# Patient Record
Sex: Female | Born: 1985
Health system: Southern US, Community
[De-identification: ages and names within clinical notes are randomized; demographics above are authoritative.]

## PROBLEM LIST (undated history)

## (undated) DIAGNOSIS — E079 Disorder of thyroid, unspecified: Secondary | ICD-10-CM

## (undated) DIAGNOSIS — F32A Depression, unspecified: Secondary | ICD-10-CM

## (undated) DIAGNOSIS — F329 Major depressive disorder, single episode, unspecified: Secondary | ICD-10-CM

## (undated) DIAGNOSIS — U071 COVID-19: Secondary | ICD-10-CM

## (undated) DIAGNOSIS — E039 Hypothyroidism, unspecified: Secondary | ICD-10-CM

## (undated) DIAGNOSIS — E611 Iron deficiency: Secondary | ICD-10-CM

## (undated) DIAGNOSIS — E669 Obesity, unspecified: Secondary | ICD-10-CM

## (undated) DIAGNOSIS — F419 Anxiety disorder, unspecified: Secondary | ICD-10-CM

## (undated) HISTORY — DX: Disorder of thyroid, unspecified: E07.9

## (undated) HISTORY — DX: Anxiety disorder, unspecified: F41.9

## (undated) HISTORY — DX: Major depressive disorder, single episode, unspecified: F32.9

## (undated) HISTORY — DX: Iron deficiency: E61.1

## (undated) HISTORY — DX: COVID-19: U07.1

## (undated) HISTORY — DX: Obesity, unspecified: E66.9

## (undated) HISTORY — DX: Depression, unspecified: F32.A

---

## 2007-05-14 ENCOUNTER — Ambulatory Visit (HOSPITAL_COMMUNITY): Admission: RE | Admit: 2007-05-14 | Discharge: 2007-05-14 | Payer: Self-pay | Admitting: Emergency Medicine

## 2007-05-14 ENCOUNTER — Emergency Department (HOSPITAL_COMMUNITY): Admission: EM | Admit: 2007-05-14 | Discharge: 2007-05-14 | Payer: Self-pay | Admitting: Emergency Medicine

## 2008-04-11 ENCOUNTER — Inpatient Hospital Stay (HOSPITAL_COMMUNITY): Admission: AD | Admit: 2008-04-11 | Discharge: 2008-04-12 | Payer: Self-pay | Admitting: Obstetrics and Gynecology

## 2008-06-13 IMAGING — US US ABDOMEN COMPLETE
1 series · 14 of 25 positions shown · non-contrast
Comparison: None.

CLINICAL DATA: Abdominal pain. Nausea. 
 ABDOMEN ULTRASOUND:
TECHNIQUE: Complete abdominal ultrasound examination was performed including evaluation of the liver, gallbladder, bile ducts, pancreas, kidneys, spleen, IVC, and abdominal aorta.

[Series 1: unknown · 0.28mm/px · 14 of 82 slices shown]
[im 1/82]
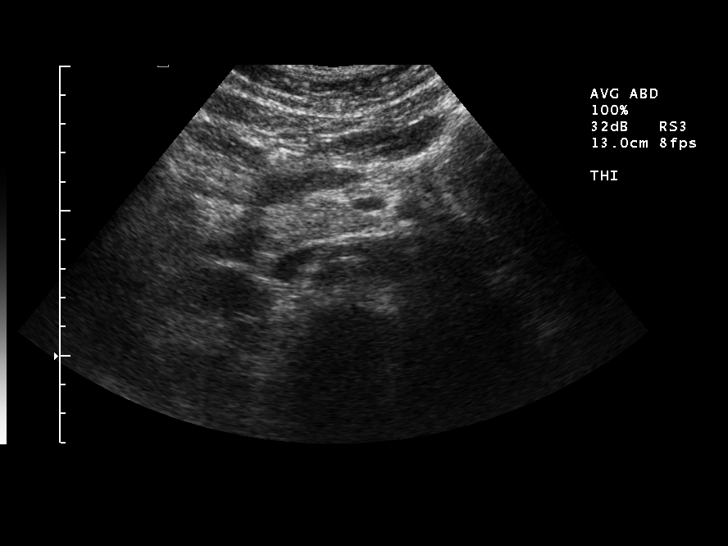
[im 7/82]
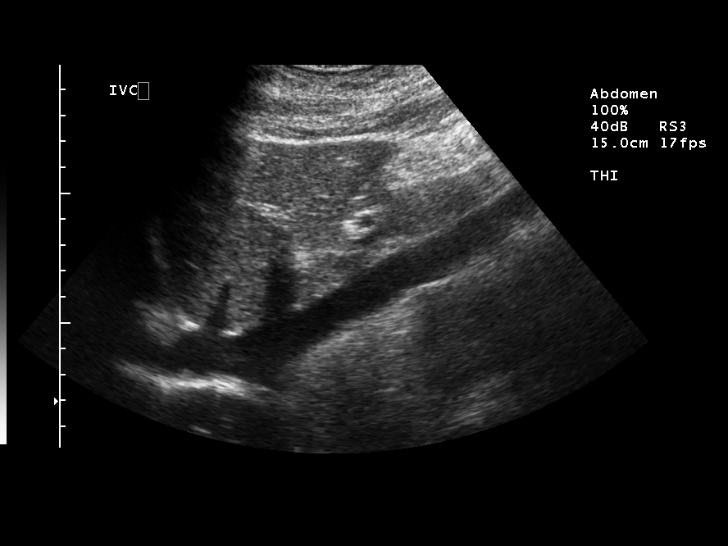
[im 14/82]
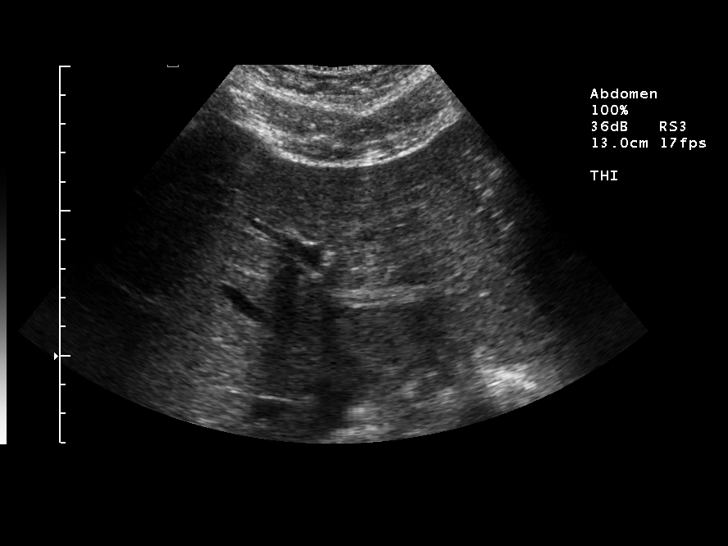
[im 21/82]
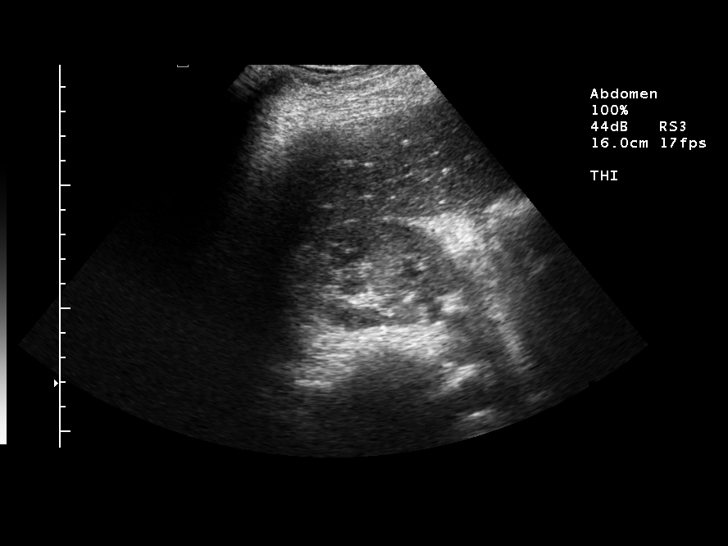
[im 28/82]
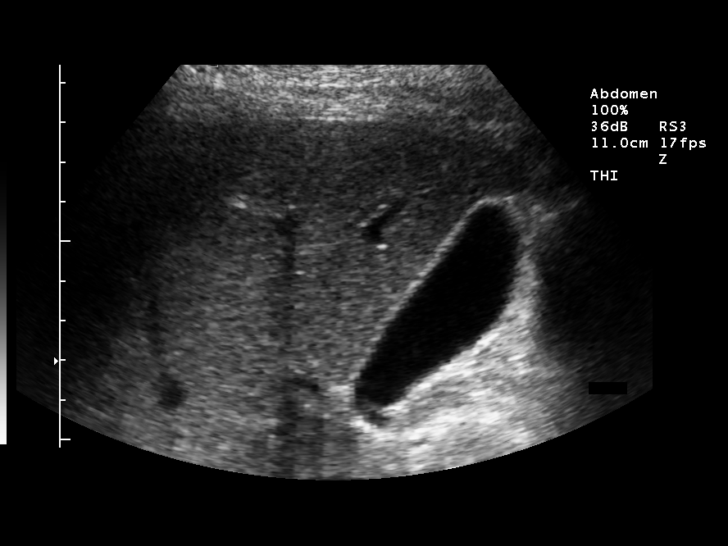
[im 31/82]
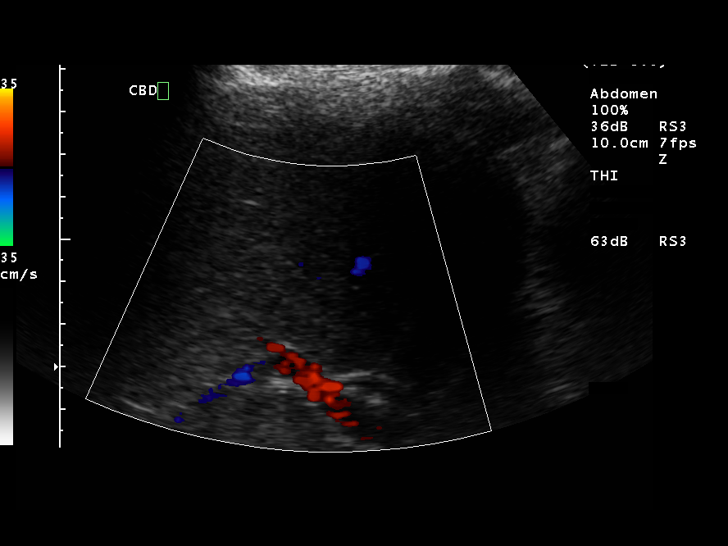
[im 38/82]
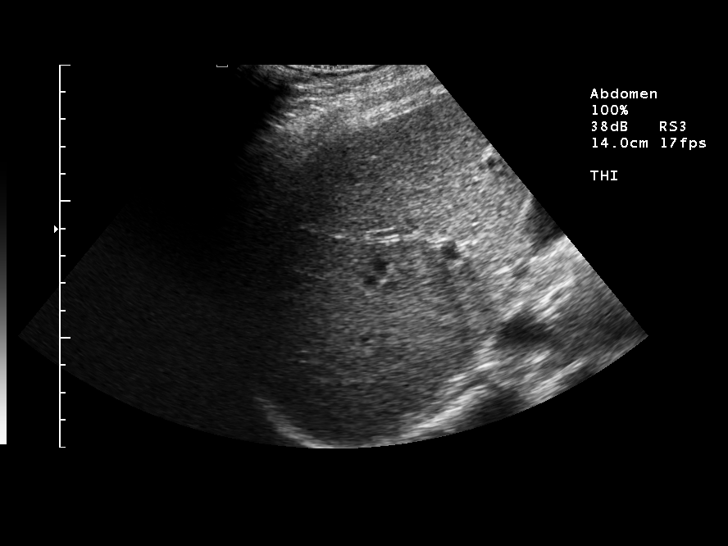
[im 44/82]
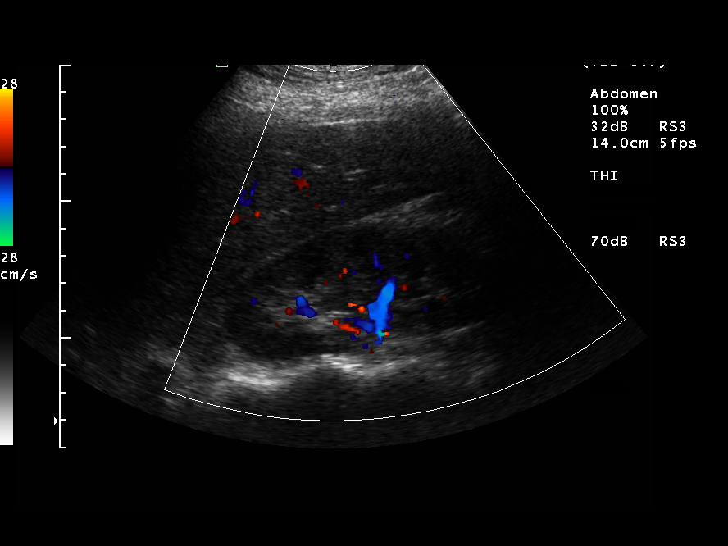
[im 51/82]
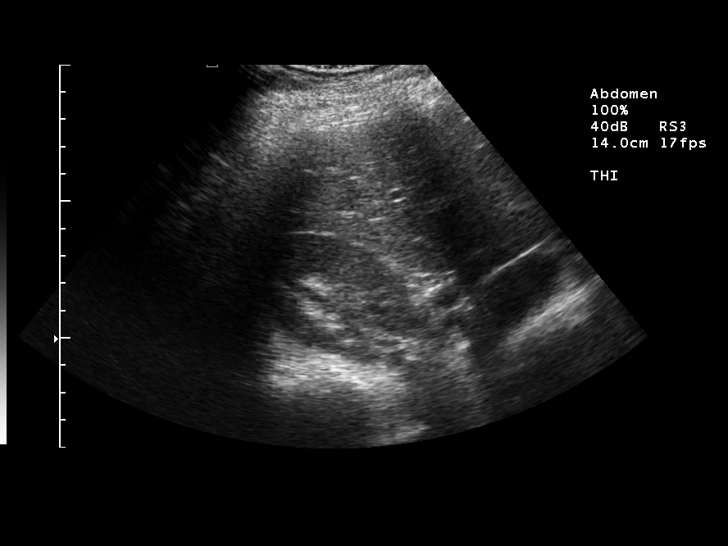
[im 55/82]
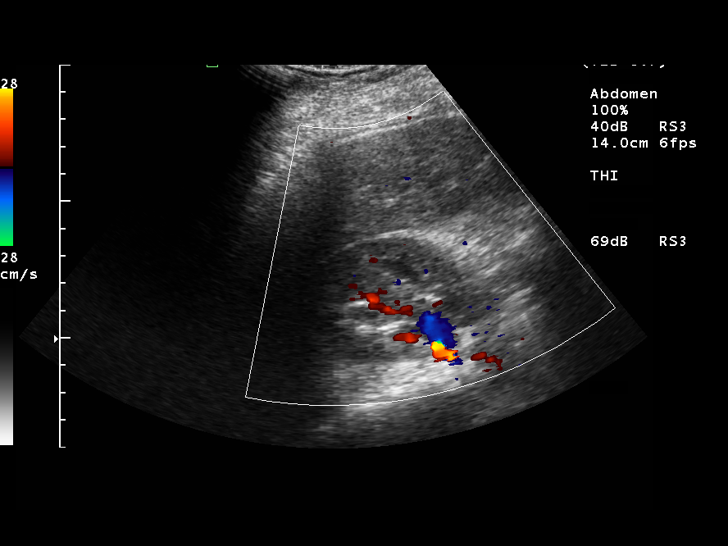
[im 61/82]
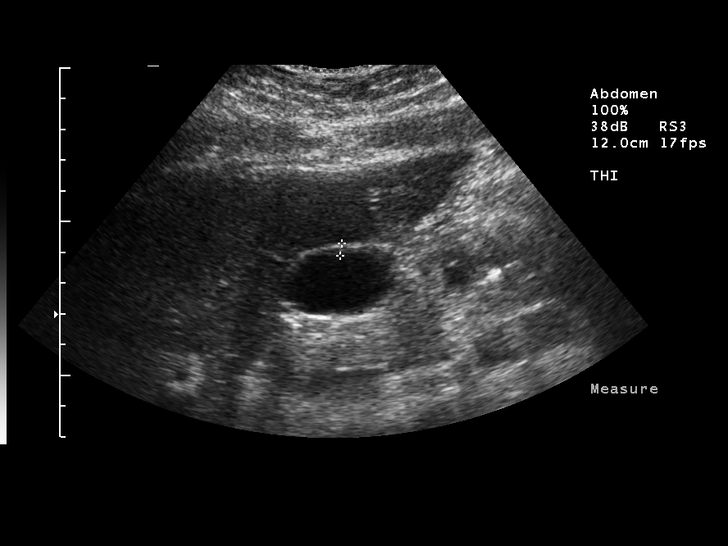
[im 68/82]
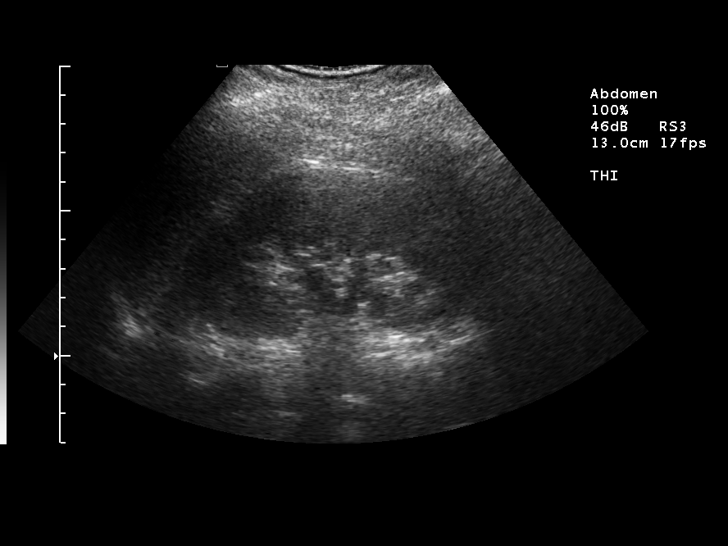
[im 75/82]
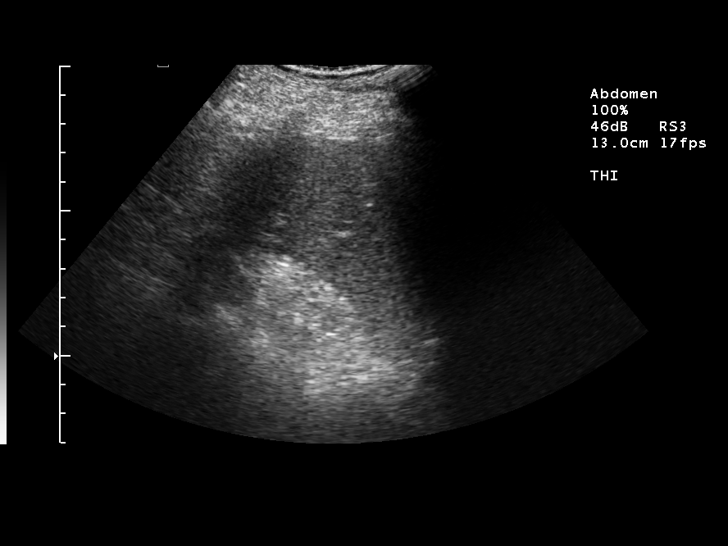
[im 82/82]
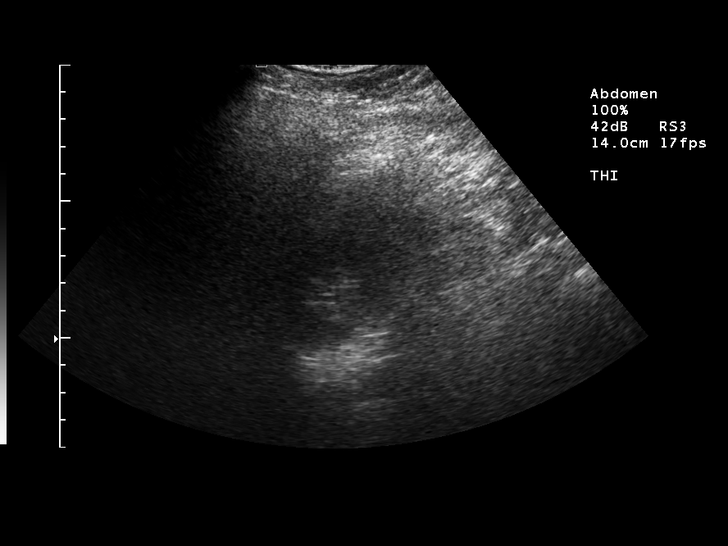

[14 of 25 positions shown; findings below may reference images not displayed]

FINDINGS: Negative for gallstones. Gallbladder wall is not thickened but the patient is painful over the gallbladder area. Common bile duct is 3.6 mm. Liver, IVC, pancreas, kidney, spleen and aorta are normal.
IMPRESSION: Negative.

## 2008-08-10 ENCOUNTER — Inpatient Hospital Stay (HOSPITAL_COMMUNITY): Admission: AD | Admit: 2008-08-10 | Discharge: 2008-08-10 | Payer: Self-pay | Admitting: Obstetrics and Gynecology

## 2008-08-16 ENCOUNTER — Inpatient Hospital Stay (HOSPITAL_COMMUNITY): Admission: AD | Admit: 2008-08-16 | Discharge: 2008-08-16 | Payer: Self-pay | Admitting: Obstetrics and Gynecology

## 2008-08-21 ENCOUNTER — Inpatient Hospital Stay (HOSPITAL_COMMUNITY): Admission: AD | Admit: 2008-08-21 | Discharge: 2008-08-24 | Payer: Self-pay | Admitting: Obstetrics and Gynecology

## 2009-03-12 LAB — CONVERTED CEMR LAB

## 2009-10-06 ENCOUNTER — Ambulatory Visit: Payer: Self-pay | Admitting: Family Medicine

## 2009-10-06 DIAGNOSIS — E669 Obesity, unspecified: Secondary | ICD-10-CM | POA: Insufficient documentation

## 2009-10-06 DIAGNOSIS — E039 Hypothyroidism, unspecified: Secondary | ICD-10-CM | POA: Insufficient documentation

## 2009-10-06 DIAGNOSIS — J029 Acute pharyngitis, unspecified: Secondary | ICD-10-CM | POA: Insufficient documentation

## 2009-10-06 DIAGNOSIS — F329 Major depressive disorder, single episode, unspecified: Secondary | ICD-10-CM

## 2009-10-06 DIAGNOSIS — H6690 Otitis media, unspecified, unspecified ear: Secondary | ICD-10-CM | POA: Insufficient documentation

## 2009-10-06 DIAGNOSIS — F411 Generalized anxiety disorder: Secondary | ICD-10-CM | POA: Insufficient documentation

## 2009-11-24 ENCOUNTER — Ambulatory Visit: Payer: Self-pay | Admitting: Family Medicine

## 2009-11-24 DIAGNOSIS — J069 Acute upper respiratory infection, unspecified: Secondary | ICD-10-CM | POA: Insufficient documentation

## 2009-11-24 DIAGNOSIS — H109 Unspecified conjunctivitis: Secondary | ICD-10-CM | POA: Insufficient documentation

## 2011-01-26 ENCOUNTER — Ambulatory Visit (INDEPENDENT_AMBULATORY_CARE_PROVIDER_SITE_OTHER): Payer: BC Managed Care – PPO | Admitting: Family Medicine

## 2011-01-26 ENCOUNTER — Encounter: Payer: Self-pay | Admitting: Family Medicine

## 2011-01-26 DIAGNOSIS — J018 Other acute sinusitis: Secondary | ICD-10-CM | POA: Insufficient documentation

## 2011-02-01 ENCOUNTER — Telehealth: Payer: Self-pay | Admitting: Family Medicine

## 2011-02-03 NOTE — Assessment & Plan Note (Signed)
Summary: Suzette Battiest PAIN   Vital Signs:  Patient profile:   25 year old female Height:      69.5 inches Weight:      222 pounds BMI:     32.43 Temp:     97.9 degrees F oral Pulse rate:   87 / minute Pulse rhythm:   regular BP sitting:   120 / 80  (right arm) Cuff size:   regular  Vitals Entered By: Linde Gillis CMA Duncan Dull) (January 26, 2011 12:13 PM) CC: cold   History of Present Illness: 25 yo here for progressive URI symptoms.  Started with runny nose, congestion on 2/16.  Took OTC sudafed, mucinex was hoping symptoms were viral and would improve.  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 Has sinus pressure, left ear pain.  No fevers but feels tired and run down.  Dry cough. No wheezing or SOB.  Current Medications (verified): 1)  Armour Thyroid 120 Mg Tabs (Thyroid) .... Take One Tablet By Mouth Daily 2)  Sertraline Hcl 100 Mg Tabs (Sertraline Hcl) .... Take One Tablet By Mouth Daily 3)  Nordette (28) 0.15-30 Mg-Mcg Tabs (Levonorgestrel-Ethinyl Estrad) .... Take 1 Tablet By Mouth Once A Day 4)  Phentermine Hcl 37.5 Mg Caps (Phentermine Hcl) .... Take 1 Tablet By Mouth Once A Day 5)  Ciloxan 0.3 % Soln (Ciprofloxacin Hcl) .Marland Kitchen.. 1-2 Drops Four Times Daily in Both Eyes X 7 Days. 6)  Amoxicillin 875 Mg Tabs (Amoxicillin) .Marland Kitchen.. 1 Tab By Mouth Two Times A Day X 10 Days 7)  Fluticasone Propionate 50 Mcg/act  Susp (Fluticasone Propionate) .... 2 Sprays Each Nostril Once Daily  Allergies (verified): No Known Drug Allergies  Past History:  Past Medical History: Last updated: 10/06/2009 Anxiety Depression Hypothyroidism Obesity  Past Surgical History: Last updated: 10/06/2009 Caesarean section September 2009  Family History: Last updated: 10/06/2009 Mom - hypothyroidism, HLD, HTN, depression Dad- HTN, DM  Social History: Last updated: 10/06/2009 LIves with mom and 28 year old son.  Divorced.  Is an LPN and pharmacy tech.   Currently working at [employer]. Never Smoked Alcohol use-no Drug use-no Regular exercise-no  Risk Factors: Exercise: no (10/06/2009)  Risk Factors: Smoking Status: never (10/06/2009)  Review of Systems      See HPI General:  Complains of malaise; denies fever. ENT:  Complains of earache, postnasal drainage, sinus pressure, and sore throat. Resp:  Complains of cough; denies shortness of breath, sputum productive, and wheezing.  Physical Exam  General:  Well-developed,well-nourished,in no acute distress; alert,appropriate and cooperative throughout examination, overweight appearing. non toxic appearing Ears:  thick fluid behind left TM right TM mild erythema Mouth:  pharyngeal erythema, tonsillar hypertrophy, no exudates Lungs:  Normal respiratory effort, chest expands symmetrically. Lungs are clear to auscultation, no crackles or wheezes. Heart:  Normal rate and regular rhythm. S1 and S2 normal without gallop, murmur, click, rub or other extra sounds. Extremities:  no edema Psych:  normally interactive, good eye contact, not anxious appearing, and not depressed appearing.     Impression & Recommendations:  Problem # 1:  OTHER ACUTE SINUSITIS (ICD-461.8) Assessment New Does seem consistent with bacterial sinusitis. s/p Zpack.  Given duration of symptoms, will try Amoxicillin 875 mg two times a day, add Flonse.   Her updated medication list for this problem includes:    Amoxicillin 875 Mg Tabs (Amoxicillin) .Marland Kitchen... 1 tab by mouth two times a day x 10 days    Fluticasone Propionate 50  Mcg/act Susp (Fluticasone propionate) .Marland Kitchen... 2 sprays each nostril once daily  Complete Medication List: 1)  Armour Thyroid 120 Mg Tabs (Thyroid) .... Take one tablet by mouth daily 2)  Sertraline Hcl 100 Mg Tabs (Sertraline hcl) .... Take one tablet by mouth daily 3)  Nordette (28) 0.15-30 Mg-mcg Tabs (Levonorgestrel-ethinyl estrad) .... Take 1 tablet by mouth once a day 4)  Phentermine Hcl  37.5 Mg Caps (Phentermine hcl) .... Take 1 tablet by mouth once a day 5)  Ciloxan 0.3 % Soln (Ciprofloxacin hcl) .Marland Kitchen.. 1-2 drops four times daily in both eyes x 7 days. 6)  Amoxicillin 875 Mg Tabs (Amoxicillin) .Marland Kitchen.. 1 tab by mouth two times a day x 10 days 7)  Fluticasone Propionate 50 Mcg/act Susp (Fluticasone propionate) .... 2 sprays each nostril once daily  Patient Instructions: 1)  Take amoxicillin as directed.  Drink lots of fluids.  Treat sympotmatically with Mucinex, nasal saline irrigation, and Tylenol/Ibuprofen. Also try claritin D or zyrtec D over the counter- two times a day as needed ( have to sign for them at pharmacy). You can use warm compresses.  Cough suppressant at night. Call if not improving as expected in 5-7 days.  Prescriptions: FLUTICASONE PROPIONATE 50 MCG/ACT  SUSP (FLUTICASONE PROPIONATE) 2 sprays each nostril once daily  #1 vial x 3   Entered and Authorized by:   Ruthe Mannan MD   Signed by:   Ruthe Mannan MD on 01/26/2011   Method used:   Electronically to        [facility]* (retail)       [address]       [place], [place]  [address]       Ph: [phone]       Fax: [phone]   RxID:   [id number] AMOXICILLIN 875 MG TABS (AMOXICILLIN) 1 tab by mouth two times a day x 10 days  #20 x 0   Entered and Authorized by:   Ruthe Mannan MD   Signed by:   Ruthe Mannan MD on 01/26/2011   Method used:   Electronically to        [facility]* (retail)       [address]       [place], [place]  [address]       Ph: [phone]       Fax: [phone]   RxID:   [id number]    Orders Added: 1)  Est. Patient Level III [57322]    Current Allergies (reviewed today): No known allergies

## 2011-02-08 NOTE — Progress Notes (Signed)
Summary: wants something for yeast infection  Phone Note Call from Patient   Caller: Patient Summary of Call: Pt has been on amox and now has a yeast infection.  She is asking that something be sent to [facility].            Initial call taken by: Lowella Petties CMA, AAMA,  February 01, 2011 9:13 AM    New/Updated Medications: DIFLUCAN 150 MG TABS (FLUCONAZOLE) once daily Prescriptions: DIFLUCAN 150 MG TABS (FLUCONAZOLE) once daily  #1 x 0   Entered and Authorized by:   Ruthe Mannan MD   Signed by:   Ruthe Mannan MD on 02/01/2011   Method used:   Electronically to        [facility]* (retail)       [address]       [place], [place]  [address]       Ph: [phone]       Fax: [phone]   RxID:   [id number]

## 2011-03-03 ENCOUNTER — Encounter: Payer: Self-pay | Admitting: Family Medicine

## 2011-03-03 LAB — HM PAP SMEAR

## 2011-03-07 ENCOUNTER — Encounter: Payer: Self-pay | Admitting: Family Medicine

## 2011-03-07 ENCOUNTER — Ambulatory Visit (INDEPENDENT_AMBULATORY_CARE_PROVIDER_SITE_OTHER): Payer: BC Managed Care – PPO | Admitting: Family Medicine

## 2011-03-07 DIAGNOSIS — E039 Hypothyroidism, unspecified: Secondary | ICD-10-CM

## 2011-03-07 DIAGNOSIS — F3289 Other specified depressive episodes: Secondary | ICD-10-CM

## 2011-03-07 DIAGNOSIS — F329 Major depressive disorder, single episode, unspecified: Secondary | ICD-10-CM

## 2011-03-07 LAB — TSH: TSH: 0.55 u[IU]/mL (ref 0.35–5.50)

## 2011-03-07 MED ORDER — BUPROPION HCL ER (XL) 150 MG PO TB24: 150.0000 mg | ORAL_TABLET | ORAL | Status: DC

## 2011-03-07 NOTE — Patient Instructions (Signed)
Let's wean off Sertraline- 1 tab every other day for 1 week, then 1/2 tab every other day for one week and stop. Call me in a month with an update.

## 2011-03-07 NOTE — Assessment & Plan Note (Signed)
Deteriorated. >25 min spent with patient, at least half of which was spent on counseling on her symptoms and treatment options. Will start Wellbutrin, wean off Zoloft.

## 2011-03-07 NOTE — Assessment & Plan Note (Signed)
Continue current dose of armour. Recheck TSH today.

## 2011-03-07 NOTE — Progress Notes (Signed)
25 yo for follow up hypothyroidism, depression.  Depression/anxiety- diagnosed 5 years ago.   Had been on Lexapro, OBGYN switched her to Zoloft 50 mg daily a few months ago, then increased to 100 mg daily. Feels like it is not working anymore.  Very much in love with her fiance but has no sex drive and has had an increase in anhedonia and tearfulness over past several months.  Hypothyroidism- started after she delivered her baby last September.  Had more fatigue and weight gain.  Has been on Armour 120 micrograms daily.  Has not had her thyroid function checked in a while.   Review of Systems       See HPI. CV:  Denies chest pain or discomfort. Resp:  Denies cough. GI:  Denies abdominal pain, bloody stools, and change in bowel habits. GU:  Denies discharge and dysuria. Derm:  Denies rash. Psych:  Denies suicidal thoughts/plans, thoughts of violence, and unusual visions or sounds. Endo:  Denies cold intolerance and heat intolerance.  Physical Exam BP 118/78  Pulse 88  Temp(Src) 98.4 F (36.9 C) (Oral)  Ht 5\' 9"  (1.753 m)  Wt 218 lb (98.884 kg)  BMI 32.19 kg/m2  General:  Well-developed,well-nourished,in no acute distress; alert,appropriate and cooperative throughout examination, overweight appearing. Mouth:  pharyngeal erythema, tonsillar hypertrophy, white plaques.   Neck:  No deformities, masses, or tenderness noted. Lungs:  Normal respiratory effort, chest expands symmetrically. Lungs are clear to auscultation, no crackles or wheezes. Heart:  Normal rate and regular rhythm. S1 and S2 normal without gallop, murmur, click, rub or other extra sounds. Abdomen:  Bowel sounds positive,abdomen soft and non-tender without masses, organomegaly or hernias noted. Msk:  No deformity or scoliosis noted of thoracic or lumbar spine.   Skin:  Intact without suspicious lesions or rashes Psych:  normally interactive, good eye contact, not anxious appearing, and not depressed appearing.

## 2011-03-08 ENCOUNTER — Other Ambulatory Visit: Payer: Self-pay | Admitting: Family Medicine

## 2011-03-08 MED ORDER — VITAMIN D3 1.25 MG (50000 UT) PO CAPS: 1.0000 | ORAL_CAPSULE | ORAL | Status: DC

## 2011-03-08 NOTE — Progress Notes (Signed)
rx sent

## 2011-03-18 ENCOUNTER — Other Ambulatory Visit: Payer: Self-pay | Admitting: *Deleted

## 2011-03-18 MED ORDER — THYROID 120 MG PO TABS: ORAL_TABLET | ORAL | Status: DC

## 2011-04-04 ENCOUNTER — Other Ambulatory Visit: Payer: Self-pay | Admitting: Family Medicine

## 2011-04-04 ENCOUNTER — Other Ambulatory Visit: Payer: Self-pay | Admitting: *Deleted

## 2011-04-04 MED ORDER — BUPROPION HCL ER (XL) 150 MG PO TB24: 300.0000 mg | ORAL_TABLET | ORAL | Status: DC

## 2011-04-04 NOTE — Telephone Encounter (Signed)
Patient called let you know that she has done fine with coming off of the sertraline, and wellbutrin seems to doing fine, but she would like to increase it, she doesn't think it is strong enough.

## 2011-04-05 MED ORDER — PHENTERMINE HCL 37.5 MG PO CAPS: 37.5000 mg | ORAL_CAPSULE | ORAL | Status: DC

## 2011-04-05 NOTE — Telephone Encounter (Signed)
Ok to call in refill, needs to come in for BP check in 1 month.

## 2011-04-05 NOTE — Telephone Encounter (Signed)
Are you willing to increase her Wellbutrin dose?

## 2011-04-05 NOTE — Telephone Encounter (Signed)
Rx called to [facility]. 

## 2011-04-05 NOTE — Telephone Encounter (Signed)
Patient stated that at her last office visit she told Dr. Dayton Martes that she was not seeing the other provider anymore who prescribed Phentermine and that she only sees Dr. Dayton Martes now and that Dr. Dayton Martes wouldn't have a problem filling it.  Also, she wanted to know if you will increase her Wellbutrin.

## 2011-04-06 ENCOUNTER — Telehealth: Payer: Self-pay | Admitting: *Deleted

## 2011-04-06 MED ORDER — BUPROPION HCL ER (XL) 300 MG PO TB24: 300.0000 mg | ORAL_TABLET | ORAL | Status: DC

## 2011-04-06 NOTE — Telephone Encounter (Signed)
Patient called and wanted to know if Dr. Dayton Martes would increase the Wellbutrin from 150mg  to 300mg .  Please advise, she uses [facility] pharmacy.

## 2011-04-06 NOTE — Telephone Encounter (Signed)
Rx called to [facility], patient notified via telephone.

## 2011-04-06 NOTE — Telephone Encounter (Signed)
Yes.  rx sent.

## 2011-04-12 NOTE — Op Note (Signed)
[name], Kaitlyn Dalton                 ACCOUNT NO.:  [id number]   MEDICAL RECORD NO.:  [id number]          PATIENT TYPE:  INP   LOCATION:  9199                          FACILITY:  [facility]   PHYSICIAN:  Malva Limes, M.D.    DATE OF BIRTH:  09-Sep-1986   DATE OF PROCEDURE:  08/21/2008  DATE OF DISCHARGE:                               OPERATIVE REPORT   PREOPERATIVE DIAGNOSES:  1. Intrauterine pregnancy at term.  2. Suspected macrosomia.   POSTOPERATIVE DIAGNOSES:  1. Intrauterine pregnancy at term.  2. Suspected macrosomia.   PROCEDURE:  Primary low transverse cesarean section.   SURGEON:  Malva Limes, MD   ASSISTANT:  Lodema Hong, MD   ANESTHESIA:  Spinal.   ANTIBIOTICS:  Ancef 1 g.   DRAINS:  Foley bedside drainage.   ESTIMATED BLOOD LOSS:  900 mL.   SPECIMENS:  None.   FINDINGS:  The patient had normal fallopian tubes and ovaries  bilaterally.  Uterus appeared to be normal.  The patient delivered 1  live viable white female infant weighing 8 pounds 12 ounces.  Apgars 9 at  1-minute and 9 at 5 minutes.   PROCEDURE:  The patient was taken to the operating room where spinal  anesthetic was administered without complications.  She was then placed  in the dorsal supine position with a left lateral tilt.  She was prepped  and draped in the usual fashion for this procedure.  A Foley catheter  was placed.  A Pfannenstiel incision was then made 2 cm above the pubic  symphysis on entering the parietal cavity.  The uterus was palpated and  felt to be normal.  The bladder flap was taken down sharply.  A low  transverse uterine incision was made in the midline with the Metzenbaum  scissors.  Amniotic fluid was noted to be clear.  The infant's head was  then delivered with the vacuum extractor.  Once the head was delivered,  the oropharynx and nostrils were bulb suctioned.  The remaining infant  was then delivered.  Cord doubly clamped, cut, and then the infant  handed to the awaiting  NICU team.  The placenta was manually removed.  The uterus was exteriorized and cleaned with wet lap.  The uterine  incision was closed using a single layer of 0 Monocryl suture in a  running locking fashion.  The bladder flap was closed using 2-0 Monocryl  in a running fashion. The uterus was placed back in the abdominal  cavity.  Hemostasis was checked and felt to be adequate.  The parietal  peritoneum and rectus muscles were reapproximated in midline using 2-0  Monocryl in a running fashion.  The fascia was closed using 0 Monocryl  suture in a running fashion.  Subcuticular tissue was made hemostatic  with the Bovie.  The subcuticular space was closed using interrupted 2-0  plain gut suture.  Stainless steel clip was used to close the skin.  The  patient tolerated the procedure well.  She was taken to recovery room in  stable condition.  Instrument and lap counts were correct x2.  ______________________________  Malva Limes, M.D.     MA/MEDQ  D:  08/21/2008  T:  08/21/2008  Job:  [id number]

## 2011-04-12 NOTE — Discharge Summary (Signed)
Kaitlyn Dalton, Kaitlyn Dalton                 ACCOUNT NO.:  [id number]   MEDICAL RECORD NO.:  [id number]          PATIENT TYPE:  INP   LOCATION:  9140                          FACILITY:  [facility]   PHYSICIAN:  Gerrit Friends. Aldona Bar, M.D.   DATE OF BIRTH:  Jan 05, 1986   DATE OF ADMISSION:  08/21/2008  DATE OF DISCHARGE:  08/24/2008                               DISCHARGE SUMMARY   DISCHARGE DIAGNOSES:  1. Term pregnancy delivered 8-pound 12-ounce female infant, Apgars 9 and      9.  2. Blood type A positive.  3. Suspected macrosomia.   PROCEDURE:  Primary low-transverse cesarean section.   SUMMARY:  This 25 year old primigravida was followed in the office  during her pregnancy and was admitted at term for primary low-transverse  cesarean section because of suspected macrosomia.  She was taken to the  operating room on August 21, 2008, by Dr. Dareen Piano and delivered a 8-  pound 12-ounce female infant with good Apgars.  Her postpartum course was  benign.  Her discharge hemoglobin was 9.8 with a white count of 6100 and  a platelet count of 185,000.  She remained afebrile during her hospital  course.  On the morning of discharge, she was breast-feeding without  difficulty, having normal bowel and bladder function, and was afebrile.  Her wound was clean and dry.  She was ambulating well and tolerating a  regular diet well.  She was desirous of discharge.   She was given all appropriate instructions on the morning of discharge  per discharge brochure and understood all instructions well.  Her  staples were removed and wound was Steri-Stripped at the time of  discharge.  She was appropriately instructed.   Discharge medications include; vitamins 1 a day as long she is breast-  feeding, Feosol capsules - 1 daily, Motrin 600 mg every 6 hours as  needed for cramping or pain, and Tylox 1-2 or 4-6 hours as needed for  more severe pain.  She will return to the office for followup in  approximately 4 weeks' time or as  needed.   CONDITION ON DISCHARGE:  Improved.       Gerrit Friends. Aldona Bar, M.D.  Electronically Signed     RMW/MEDQ  D:  08/24/2008  T:  08/24/2008  Job:  [id number]

## 2011-06-06 ENCOUNTER — Other Ambulatory Visit: Payer: Self-pay | Admitting: *Deleted

## 2011-06-06 NOTE — Telephone Encounter (Signed)
Denied. Needs to be seen for BP check before I can refill.

## 2011-06-07 NOTE — Telephone Encounter (Signed)
Left message on pharmacy voicemail advising as instructed.

## 2011-08-04 ENCOUNTER — Other Ambulatory Visit: Payer: Self-pay | Admitting: *Deleted

## 2011-08-04 NOTE — Telephone Encounter (Signed)
Patient advised as instructed via telephone, she stated that she will take Vitamin D OTC instead of getting a refill.  She doesn't like needles and isn't to happy with having to do lab work.

## 2011-08-04 NOTE — Telephone Encounter (Signed)
Received faxed refill request for Vitamin D.  Last OV on 03/07/2011, is patient suppose to continue Vitamin D indefinitely?  Please advise.

## 2011-08-04 NOTE — Telephone Encounter (Signed)
No let's recheck her Vit D levels first. Thanks

## 2011-08-24 LAB — COMPREHENSIVE METABOLIC PANEL
ALT: 18
Alkaline Phosphatase: 80
CO2: 23
Chloride: 106
GFR calc non Af Amer: >60
Glucose, Bld: 106 — ABNORMAL HIGH
Potassium: 3.8
Sodium: 138

## 2011-08-24 LAB — CBC
Hemoglobin: 13.3
RBC: 4.36
WBC: 12.1 — ABNORMAL HIGH

## 2011-08-29 LAB — CBC
MCHC: 33.4
Platelets: 185
Platelets: 246
RBC: 3.29 — ABNORMAL LOW
RDW: 13.2
WBC: 6.1
WBC: 6.4

## 2011-08-29 LAB — RPR: RPR Ser Ql: NONREACTIVE

## 2011-08-31 LAB — URINALYSIS, ROUTINE W REFLEX MICROSCOPIC
Glucose, UA: NEGATIVE
Nitrite: NEGATIVE
Protein, ur: NEGATIVE
pH: 6.5

## 2011-09-14 LAB — DIFFERENTIAL
Eosinophils Absolute: 0.3
Eosinophils Relative: 6 — ABNORMAL HIGH
Lymphocytes Relative: 37
Lymphs Abs: 2.1
Monocytes Relative: 8

## 2011-09-14 LAB — PREGNANCY, URINE: Preg Test, Ur: NEGATIVE

## 2011-09-14 LAB — URINALYSIS, ROUTINE W REFLEX MICROSCOPIC
Bilirubin Urine: NEGATIVE
Hgb urine dipstick: NEGATIVE
Ketones, ur: NEGATIVE
Nitrite: NEGATIVE
Protein, ur: NEGATIVE
Specific Gravity, Urine: 1.019
Urobilinogen, UA: 0.2

## 2011-09-14 LAB — COMPREHENSIVE METABOLIC PANEL
ALT: 23
AST: 21
Albumin: 3.7
Calcium: 9.8
Creatinine, Ser: 0.78
GFR calc Af Amer: >60
GFR calc non Af Amer: >60
Sodium: 141
Total Protein: 6.5

## 2011-09-14 LAB — CBC
MCHC: 34.1
MCV: 88.1
Platelets: 244
RBC: 4.22
RDW: 12.3

## 2011-09-14 LAB — LIPASE, BLOOD: Lipase: 17

## 2011-12-14 ENCOUNTER — Other Ambulatory Visit: Payer: Self-pay | Admitting: Internal Medicine

## 2011-12-14 MED ORDER — BUPROPION HCL ER (XL) 300 MG PO TB24: 300.0000 mg | ORAL_TABLET | ORAL | Status: DC

## 2011-12-14 NOTE — Telephone Encounter (Signed)
Pharmacy requesting refill.

## 2011-12-19 NOTE — Telephone Encounter (Signed)
Rx called to [facility]. 

## 2012-03-28 ENCOUNTER — Other Ambulatory Visit: Payer: Self-pay | Admitting: *Deleted

## 2012-03-28 MED ORDER — THYROID 120 MG PO TABS: ORAL_TABLET | ORAL | Status: DC

## 2012-04-11 ENCOUNTER — Encounter: Payer: Self-pay | Admitting: Family Medicine

## 2012-04-11 ENCOUNTER — Ambulatory Visit (INDEPENDENT_AMBULATORY_CARE_PROVIDER_SITE_OTHER): Payer: BC Managed Care – PPO | Admitting: Family Medicine

## 2012-04-11 VITALS — BP 110/80 | HR 76 | Temp 98.5°F | Wt 200.0 lb

## 2012-04-11 DIAGNOSIS — J029 Acute pharyngitis, unspecified: Secondary | ICD-10-CM

## 2012-04-11 NOTE — Patient Instructions (Signed)
I think this is likely a virus. Your strep test was negative. I would take Ibuprofen 800 mg three times daily with food for next few days. Call me if your not improving over next few days.

## 2012-04-11 NOTE — Progress Notes (Signed)
Addended by: Eliezer Bottom on: 04/11/2012 10:34 AM   Modules accepted: Orders

## 2012-04-11 NOTE — Progress Notes (Signed)
SUBJECTIVE: 26 y.o. female with sore throat, myalgias x 3 days.  No fevers, no cough, no runny  Nose.  No history of rheumatic fever. Other symptoms: None  Patient Active Problem List  Diagnoses  . HYPOTHYROIDISM  . OBESITY  . ANXIETY  . DEPRESSION  . CONJUNCTIVITIS  . OTITIS MEDIA, LEFT  . SORE THROAT  . URI  . OTHER ACUTE SINUSITIS   Past Medical History  Diagnosis Date  . Anxiety   . Depression   . Thyroid disease   . Obesity    Past Surgical History  Procedure Date  . Cesarean section 07/2008   History  Substance Use Topics  . Smoking status: Former Smoker -- 10.0 packs/day for 4 years    Types: Cigarettes    Quit date: 11/29/2007  . Smokeless tobacco: Never Used  . Alcohol Use: No   Family History  Problem Relation Age of Onset  . Hypothyroidism Mother   . Hyperlipidemia Mother   . Hypertension Mother   . Depression Mother   . Hypertension Father   . Diabetes Father    No Known Allergies Current Outpatient Prescriptions on File Prior to Visit  Medication Sig Dispense Refill  . buPROPion (WELLBUTRIN XL) 300 MG 24 hr tablet Take 1 tablet (300 mg total) by mouth every morning.  30 tablet  6  . levonorgestrel-ethinyl estradiol (NORDETTE) 0.15-30 MG-MCG per tablet Take 1 tablet by mouth daily.        Marland Kitchen thyroid (ARMOUR) 120 MG tablet Take one tablet by mouth every morning 30 minutes before breakfast  30 tablet  1   The PMH, PSH, Social History, Family History, Medications, and allergies have been reviewed in [facility], and have been updated if relevant.   OBJECTIVE:  BP 110/80  Pulse 76  Temp(Src) 98.5 F (36.9 C) (Oral)  Wt 200 lb (90.719 kg)   Appears alert, well appearing, and in no distress. Ears: bilateral TM's and external ear canals normal Oropharynx: tonsils hypertrophied without exudate Neck: supple, no significant adenopathy Lungs: clear to auscultation, no wheezes, rales or rhonchi, symmetric air entry Rapid Strep test is negative  ASSESSMENT:  Viral pharyngitis  PLAN: Per orders. Gargle, use acetaminophen or other OTC analgesic. See pt instructions for details.

## 2012-06-05 ENCOUNTER — Other Ambulatory Visit: Payer: Self-pay | Admitting: Family Medicine

## 2012-06-05 DIAGNOSIS — E039 Hypothyroidism, unspecified: Secondary | ICD-10-CM

## 2012-06-06 ENCOUNTER — Other Ambulatory Visit (INDEPENDENT_AMBULATORY_CARE_PROVIDER_SITE_OTHER): Payer: BC Managed Care – PPO

## 2012-06-06 DIAGNOSIS — E039 Hypothyroidism, unspecified: Secondary | ICD-10-CM

## 2012-06-06 LAB — T4, FREE: Free T4: 0.61 ng/dL (ref 0.60–1.60)

## 2012-06-06 LAB — TSH: TSH: 0.24 u[IU]/mL — ABNORMAL LOW (ref 0.35–5.50)

## 2012-06-07 ENCOUNTER — Other Ambulatory Visit: Payer: Self-pay | Admitting: *Deleted

## 2012-06-07 MED ORDER — THYROID 120 MG PO TABS: ORAL_TABLET | ORAL | Status: DC

## 2012-08-20 ENCOUNTER — Other Ambulatory Visit: Payer: Self-pay | Admitting: *Deleted

## 2012-08-20 MED ORDER — BUPROPION HCL ER (XL) 300 MG PO TB24: 300.0000 mg | ORAL_TABLET | ORAL | Status: DC

## 2012-08-20 NOTE — Telephone Encounter (Signed)
Documented that the Wellbutrin is a written Rx, is this correct or do you want me to call it in?

## 2012-08-20 NOTE — Telephone Encounter (Signed)
Dr Dayton Martes is out today

## 2012-08-20 NOTE — Telephone Encounter (Signed)
Pt prefers called in so i called in Rx to pharm. As prescribed

## 2012-08-20 NOTE — Telephone Encounter (Signed)
Please call her and ask what she prefers -she is not my patient so I am not sure thanks

## 2012-08-20 NOTE — Telephone Encounter (Signed)
Go ahead and refil once in her PCP's absence today thanks

## 2013-04-16 ENCOUNTER — Other Ambulatory Visit: Payer: Self-pay | Admitting: Family Medicine

## 2013-04-16 NOTE — Telephone Encounter (Signed)
Rout to PCP 

## 2013-06-05 ENCOUNTER — Encounter: Payer: Self-pay | Admitting: Family Medicine

## 2013-06-05 ENCOUNTER — Ambulatory Visit (INDEPENDENT_AMBULATORY_CARE_PROVIDER_SITE_OTHER): Payer: BC Managed Care – PPO | Admitting: Family Medicine

## 2013-06-05 VITALS — BP 110/70 | HR 72 | Temp 97.8°F | Wt 205.0 lb

## 2013-06-05 DIAGNOSIS — N390 Urinary tract infection, site not specified: Secondary | ICD-10-CM

## 2013-06-05 MED ORDER — CIPROFLOXACIN HCL 500 MG PO TABS: 500.0000 mg | ORAL_TABLET | Freq: Two times a day (BID) | ORAL | Status: AC

## 2013-06-05 NOTE — Progress Notes (Signed)
SUBJECTIVE: Kaitlyn Dalton is a 27 y.o. female who complains of urinary frequency, urgency and dysuria x 2 days, without flank pain, fever, chills, or abnormal vaginal discharge or bleeding.   Patient Active Problem List   Diagnosis Date Noted  . UTI (urinary tract infection) 06/05/2013  . OTHER ACUTE SINUSITIS 01/26/2011  . CONJUNCTIVITIS 11/24/2009  . URI 11/24/2009  . HYPOTHYROIDISM 10/06/2009  . OBESITY 10/06/2009  . ANXIETY 10/06/2009  . DEPRESSION 10/06/2009  . OTITIS MEDIA, LEFT 10/06/2009  . SORE THROAT 10/06/2009   Past Medical History  Diagnosis Date  . Anxiety   . Depression   . Thyroid disease   . Obesity    Past Surgical History  Procedure Laterality Date  . Cesarean section  07/2008   History  Substance Use Topics  . Smoking status: Former Smoker -- 10.00 packs/day for 4 years    Types: Cigarettes    Quit date: 11/29/2007  . Smokeless tobacco: Never Used  . Alcohol Use: No   Family History  Problem Relation Age of Onset  . Hypothyroidism Mother   . Hyperlipidemia Mother   . Hypertension Mother   . Depression Mother   . Hypertension Father   . Diabetes Father    No Known Allergies Current Outpatient Prescriptions on File Prior to Visit  Medication Sig Dispense Refill  . buPROPion (WELLBUTRIN XL) 300 MG 24 hr tablet TAKE ONE TABLET BY MOUTH EVERY MORNING  30 tablet  6  . levonorgestrel-ethinyl estradiol (NORDETTE) 0.15-30 MG-MCG per tablet Take 1 tablet by mouth daily.        Marland Kitchen thyroid (ARMOUR) 120 MG tablet Take one tablet by mouth every morning 30 minutes before breakfast  30 tablet  11   No current facility-administered medications on file prior to visit.   The PMH, PSH, Social History, Family History, Medications, and allergies have been reviewed in [facility], and have been updated if relevant.  OBJECTIVE: BP 110/70  Pulse 72  Temp(Src) 97.8 F (36.6 C)  Wt 205 lb (92.987 kg)  BMI 30.26 kg/m2  Appears well, in no apparent distress.  Vital signs  are normal. The abdomen is soft without tenderness, guarding, mass, rebound or organomegaly. No CVA tenderness or inguinal adenopathy noted. Urine dipstick shows unable to read due to AZO  ASSESSMENT: UTI uncomplicated without evidence of pyelonephritis  PLAN: Treatment per orders -cipro 500 mg twice daily x 3 days, send urine for cx,  also push fluids, may use Pyridium OTC prn. Call or return to clinic prn if these symptoms worsen or fail to improve as anticipated.

## 2013-06-05 NOTE — Patient Instructions (Addendum)
Good to see you. Please take cipro as directed- 1 tablet twice daily x 3 days. We will call you with your urine culture.

## 2013-06-05 NOTE — Addendum Note (Signed)
Addended by: Eliezer Bottom on: 06/05/2013 11:31 AM   Modules accepted: Orders

## 2013-06-07 LAB — URINE CULTURE: Colony Count: NO GROWTH

## 2013-06-28 ENCOUNTER — Other Ambulatory Visit: Payer: Self-pay | Admitting: *Deleted

## 2013-06-28 MED ORDER — THYROID 120 MG PO TABS: ORAL_TABLET | ORAL | Status: DC

## 2013-07-15 ENCOUNTER — Encounter: Payer: Self-pay | Admitting: Family Medicine

## 2013-07-15 ENCOUNTER — Ambulatory Visit (INDEPENDENT_AMBULATORY_CARE_PROVIDER_SITE_OTHER): Payer: BC Managed Care – PPO | Admitting: Family Medicine

## 2013-07-15 VITALS — BP 104/70 | HR 90 | Temp 98.0°F | Ht 69.0 in | Wt 204.0 lb

## 2013-07-15 DIAGNOSIS — F329 Major depressive disorder, single episode, unspecified: Secondary | ICD-10-CM

## 2013-07-15 DIAGNOSIS — E039 Hypothyroidism, unspecified: Secondary | ICD-10-CM

## 2013-07-15 NOTE — Progress Notes (Signed)
27 yo for follow up hypothyroidism, depression.  Depression/anxiety- on Wellbutrin 300 mg XL daily.  Denies any symptoms of anxiety/depression.  Hypothyroidism-has had more fatigue and dry skin  Has been on Armour 120 micrograms daily.  Has not had her thyroid function checked in a while. Otherwise doing very well and has no complaints. Lab Results  Component Value Date   TSH 0.24* 06/06/2012    Wt Readings from Last 3 Encounters:  07/15/13 204 lb (92.534 kg)  06/05/13 205 lb (92.987 kg)  04/11/12 200 lb (90.719 kg)   Patient Active Problem List   Diagnosis Date Noted  . HYPOTHYROIDISM 10/06/2009  . OBESITY 10/06/2009  . ANXIETY 10/06/2009  . DEPRESSION 10/06/2009   Past Medical History  Diagnosis Date  . Anxiety   . Depression   . Thyroid disease   . Obesity    Past Surgical History  Procedure Laterality Date  . Cesarean section  07/2008   History  Substance Use Topics  . Smoking status: Former Smoker -- 10.00 packs/day for 4 years    Types: Cigarettes    Quit date: 11/29/2007  . Smokeless tobacco: Never Used  . Alcohol Use: No   Family History  Problem Relation Age of Onset  . Hypothyroidism Mother   . Hyperlipidemia Mother   . Hypertension Mother   . Depression Mother   . Hypertension Father   . Diabetes Father    No Known Allergies Current Outpatient Prescriptions on File Prior to Visit  Medication Sig Dispense Refill  . buPROPion (WELLBUTRIN XL) 300 MG 24 hr tablet TAKE ONE TABLET BY MOUTH EVERY MORNING  30 tablet  6  . levonorgestrel-ethinyl estradiol (NORDETTE) 0.15-30 MG-MCG per tablet Take 1 tablet by mouth daily.        Marland Kitchen thyroid (ARMOUR) 120 MG tablet Take one tablet by mouth every morning 30 minutes before breakfast  30 tablet  0   No current facility-administered medications on file prior to visit.   The PMH, PSH, Social History, Family History, Medications, and allergies have been reviewed in [facility], and have been updated if relevant.   Review  of Systems       See HPI. CV:  Denies chest pain or discomfort. Resp:  Denies cough. GI:  Denies abdominal pain, bloody stools, and change in bowel habits. GU:  Denies discharge and dysuria. Derm:  Denies rash. Psych:  Denies suicidal thoughts/plans, thoughts of violence, and unusual visions or sounds. Endo:  Denies cold intolerance and heat intolerance.  Physical Exam BP 104/70  Pulse 90  Temp(Src) 98 F (36.7 C)  Ht 5\' 9"  (1.753 m)  Wt 204 lb (92.534 kg)  BMI 30.11 kg/m2  General:  Well-developed,well-nourished,in no acute distress; alert,appropriate and cooperative throughout examination, overweight appearing. Mouth:  pharyngeal erythema, tonsillar hypertrophy, white plaques.   Neck:  No deformities, masses, or tenderness noted. Lungs:  Normal respiratory effort, chest expands symmetrically. Lungs are clear to auscultation, no crackles or wheezes. Heart:  Normal rate and regular rhythm. S1 and S2 normal without gallop, murmur, click, rub or other extra sounds. Abdomen:  Bowel sounds positive,abdomen soft and non-tender without masses, organomegaly or hernias noted. Msk:  No deformity or scoliosis noted of thoracic or lumbar spine.   Skin:  Intact without suspicious lesions or rashes Psych:  normally interactive, good eye contact, not anxious appearing, and not depressed appearing.    Assessment and Plan: 1. HYPOTHYROIDISM Recheck labs today prior to refilling synthroid- may need to adjust dose. - TSH -  T4, Free  2. DEPRESSION Stable on current dose of Wellbutrin. No changes.

## 2013-07-15 NOTE — Patient Instructions (Addendum)
Good to see you. We will call you with your lab results.  Call me if you need refills.

## 2013-07-16 ENCOUNTER — Other Ambulatory Visit: Payer: Self-pay | Admitting: Family Medicine

## 2013-07-16 ENCOUNTER — Telehealth: Payer: Self-pay | Admitting: *Deleted

## 2013-07-16 MED ORDER — THYROID 15 MG PO TABS: 15.0000 mg | ORAL_TABLET | Freq: Every day | ORAL | Status: DC

## 2013-07-16 MED ORDER — THYROID 120 MG PO TABS: ORAL_TABLET | ORAL | Status: DC

## 2013-07-16 MED ORDER — THYROID 130 MG PO TABS: ORAL_TABLET | ORAL | Status: DC

## 2013-07-16 NOTE — Telephone Encounter (Signed)
Advised pharmacist. 

## 2013-07-16 NOTE — Telephone Encounter (Signed)
Ok, let's send in rx for armour 120 mg daily in addition to 15 mg daily.  I will send them both.

## 2013-07-16 NOTE — Telephone Encounter (Signed)
Amy at [facility] is calling regarding patient's armour thyroid script that was sent in for 130 mg's.  She says this medicine doesn't come in that dose and there is no way to combine pills to get that dose.  States pt has always been on 120 mg's before.  Please advise.

## 2013-08-26 ENCOUNTER — Other Ambulatory Visit: Payer: Self-pay | Admitting: *Deleted

## 2013-08-26 MED ORDER — THYROID 120 MG PO TABS: ORAL_TABLET | ORAL | Status: DC

## 2013-09-17 ENCOUNTER — Encounter: Payer: Self-pay | Admitting: Family Medicine

## 2013-10-03 ENCOUNTER — Other Ambulatory Visit: Payer: Self-pay

## 2013-10-17 ENCOUNTER — Other Ambulatory Visit: Payer: Self-pay | Admitting: Internal Medicine

## 2013-10-17 MED ORDER — THYROID 120 MG PO TABS: ORAL_TABLET | ORAL | Status: DC

## 2013-10-18 NOTE — Progress Notes (Signed)
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

## 2013-10-18 NOTE — Progress Notes (Signed)
Left voicemail requesting pt to call office 

## 2013-11-25 ENCOUNTER — Other Ambulatory Visit: Payer: Self-pay | Admitting: Internal Medicine

## 2013-11-25 MED ORDER — THYROID 120 MG PO TABS: ORAL_TABLET | ORAL | Status: DC

## 2013-11-27 ENCOUNTER — Telehealth: Payer: Self-pay | Admitting: Family Medicine

## 2013-11-27 NOTE — Telephone Encounter (Signed)
Spoke to pt and informed her that paperwork has been completed and can be picked up from the front desk

## 2013-11-27 NOTE — Telephone Encounter (Signed)
Pt dropped off life insurance forms to be filled out. I put them in Dr. Elmer Sow inbox on her desk. Pt ask if we could call her and let her know when they were finished and ask if we could amil it off for her.

## 2013-11-29 ENCOUNTER — Telehealth: Payer: Self-pay | Admitting: Family Medicine

## 2013-11-29 MED ORDER — THYROID 120 MG PO TABS: ORAL_TABLET | ORAL | Status: DC

## 2013-11-29 NOTE — Telephone Encounter (Signed)
Received a message along with faxed lab results from pt.  She is requesting refill of her thyroid medication. TSH 0.745, FT4 0.87.  Rx refilled.

## 2013-12-17 ENCOUNTER — Other Ambulatory Visit: Payer: Self-pay | Admitting: Family Medicine

## 2014-03-14 ENCOUNTER — Other Ambulatory Visit: Payer: Self-pay | Admitting: Family Medicine

## 2014-03-14 NOTE — Telephone Encounter (Signed)
Pt requesting medication refill. Last ov 06/2013 with no future appt scheduled. pls advise

## 2015-09-23 LAB — OB RESULTS CONSOLE HIV ANTIBODY (ROUTINE TESTING)
HIV: NONREACTIVE
HIV: NONREACTIVE
HIV: NONREACTIVE
HIV: NONREACTIVE

## 2015-09-23 LAB — OB RESULTS CONSOLE RPR
RPR: NONREACTIVE
RPR: NONREACTIVE
RPR: NONREACTIVE
RPR: NONREACTIVE

## 2015-09-23 LAB — OB RESULTS CONSOLE ABO/RH: RH TYPE: POSITIVE

## 2015-09-23 LAB — HM PAP SMEAR: HM PAP: NEGATIVE

## 2015-09-23 LAB — OB RESULTS CONSOLE HEPATITIS B SURFACE ANTIGEN: Hepatitis B Surface Ag: NEGATIVE

## 2015-09-23 LAB — OB RESULTS CONSOLE VARICELLA ZOSTER ANTIBODY, IGG: VARICELLA IGG: IMMUNE

## 2015-09-23 LAB — OB RESULTS CONSOLE RUBELLA ANTIBODY, IGM: Rubella: IMMUNE

## 2015-09-23 LAB — OB RESULTS CONSOLE ANTIBODY SCREEN: ANTIBODY SCREEN: NEGATIVE

## 2015-11-29 NOTE — L&D Delivery Note (Addendum)
Delivery Note  EGA: 40.5 At 7:40 AM a viable female was delivered via Vaginal, Spontaneous Delivery (Presentation: LOA).  APGAR: 8, 9; weight not yet determined.   Placenta status: Intact, Spontaneous.  Cord: 3 vessels with the following complications: None.  Cord pH: not collected  Anesthesia: Epidural  Episiotomy: no  Lacerations:  2nd degree Suture Repair: 3.0 Est. Blood Loss (mL):  300cc  Mom to postpartum.  Baby to Couplet care / Skin to Skin.   patient was induced via foley bulb with pitocin, and AROM. Epidural without complication. She progressed to complete and had a 30min second stage. Nuchal/body cord x1, reduced with delivery. Baby placed on mom's chest and after a short delay, cord was doubly clamped and cut by FOB. Placenta delivered intact, spontaneously, and 2nd degree perineal repair performed. We sang Happy [place] Ouch[place]irthday to baby [name].  Kaitlyn Dalton 05/06/2016, 8:12 AM

## 2016-04-05 LAB — OB RESULTS CONSOLE GBS: GBS: NEGATIVE

## 2016-05-05 ENCOUNTER — Inpatient Hospital Stay: Payer: Commercial Managed Care - HMO | Admitting: Anesthesiology

## 2016-05-05 ENCOUNTER — Encounter: Payer: Self-pay | Admitting: Anesthesiology

## 2016-05-05 ENCOUNTER — Inpatient Hospital Stay: Admission: RE | Admit: 2016-05-05 | Payer: Self-pay | Source: Ambulatory Visit

## 2016-05-05 ENCOUNTER — Inpatient Hospital Stay
Admission: EM | Admit: 2016-05-05 | Discharge: 2016-05-07 | DRG: 775 | Disposition: A | Discharge status: Home / Self Care | Payer: Commercial Managed Care - HMO | Attending: Obstetrics and Gynecology | Admitting: Obstetrics and Gynecology

## 2016-05-05 DIAGNOSIS — Z3A40 40 weeks gestation of pregnancy: Secondary | ICD-10-CM | POA: Diagnosis not present

## 2016-05-05 DIAGNOSIS — F419 Anxiety disorder, unspecified: Secondary | ICD-10-CM | POA: Diagnosis present

## 2016-05-05 DIAGNOSIS — O99283 Endocrine, nutritional and metabolic diseases complicating pregnancy, third trimester: Secondary | ICD-10-CM | POA: Diagnosis present

## 2016-05-05 DIAGNOSIS — O99344 Other mental disorders complicating childbirth: Secondary | ICD-10-CM | POA: Diagnosis present

## 2016-05-05 DIAGNOSIS — Z8249 Family history of ischemic heart disease and other diseases of the circulatory system: Secondary | ICD-10-CM | POA: Diagnosis not present

## 2016-05-05 DIAGNOSIS — F32A Depression, unspecified: Secondary | ICD-10-CM | POA: Diagnosis present

## 2016-05-05 DIAGNOSIS — E039 Hypothyroidism, unspecified: Secondary | ICD-10-CM | POA: Diagnosis present

## 2016-05-05 DIAGNOSIS — O480 Post-term pregnancy: Secondary | ICD-10-CM | POA: Diagnosis present

## 2016-05-05 DIAGNOSIS — O99284 Endocrine, nutritional and metabolic diseases complicating childbirth: Secondary | ICD-10-CM | POA: Diagnosis present

## 2016-05-05 DIAGNOSIS — O34219 Maternal care for unspecified type scar from previous cesarean delivery: Secondary | ICD-10-CM | POA: Diagnosis present

## 2016-05-05 DIAGNOSIS — O99214 Obesity complicating childbirth: Secondary | ICD-10-CM | POA: Diagnosis present

## 2016-05-05 DIAGNOSIS — E669 Obesity, unspecified: Secondary | ICD-10-CM | POA: Diagnosis present

## 2016-05-05 DIAGNOSIS — Z6833 Body mass index (BMI) 33.0-33.9, adult: Secondary | ICD-10-CM

## 2016-05-05 DIAGNOSIS — O99210 Obesity complicating pregnancy, unspecified trimester: Secondary | ICD-10-CM | POA: Diagnosis present

## 2016-05-05 DIAGNOSIS — Z833 Family history of diabetes mellitus: Secondary | ICD-10-CM

## 2016-05-05 DIAGNOSIS — F329 Major depressive disorder, single episode, unspecified: Secondary | ICD-10-CM | POA: Diagnosis present

## 2016-05-05 HISTORY — DX: Hypothyroidism, unspecified: E03.9

## 2016-05-05 LAB — CBC
HCT: 35.4 % (ref 35.0–47.0)
HEMOGLOBIN: 11.8 g/dL — AB (ref 12.0–16.0)
MCH: 27.5 pg (ref 26.0–34.0)
MCHC: 33.4 g/dL (ref 32.0–36.0)
MCV: 82.5 fL (ref 80.0–100.0)
PLATELETS: 231 10*3/uL (ref 150–440)
RBC: 4.29 MIL/uL (ref 3.80–5.20)
RDW: 13.9 % (ref 11.5–14.5)
WBC: 9 10*3/uL (ref 3.6–11.0)

## 2016-05-05 LAB — TYPE AND SCREEN
ABO/RH(D): A POS
Antibody Screen: NEGATIVE

## 2016-05-05 MED ORDER — LACTATED RINGERS IV SOLN: 500.0000 mL | INTRAVENOUS | Status: DC | PRN

## 2016-05-05 MED ORDER — OXYTOCIN 40 UNITS IN LACTATED RINGERS INFUSION - SIMPLE MED
1.0000 m[IU]/min | INTRAVENOUS | Status: DC
  Administered 2016-05-05: 1 m[IU]/min via INTRAVENOUS
  Filled 2016-05-05: qty 1000

## 2016-05-05 MED ORDER — OXYTOCIN 10 UNIT/ML IJ SOLN
INTRAMUSCULAR | Status: AC
  Filled 2016-05-05: qty 2

## 2016-05-05 MED ORDER — OXYTOCIN BOLUS FROM INFUSION
500.0000 mL | INTRAVENOUS | Status: DC
  Administered 2016-05-06: 500 mL via INTRAVENOUS

## 2016-05-05 MED ORDER — MISOPROSTOL 200 MCG PO TABS
ORAL_TABLET | ORAL | Status: AC
  Filled 2016-05-05: qty 4

## 2016-05-05 MED ORDER — OXYTOCIN 40 UNITS IN LACTATED RINGERS INFUSION - SIMPLE MED
2.5000 [IU]/h | INTRAVENOUS | Status: DC
Start: 2016-05-05 — End: 2016-05-07
  Administered 2016-05-06: 2.5 [IU]/h via INTRAVENOUS

## 2016-05-05 MED ORDER — FENTANYL 2.5 MCG/ML W/ROPIVACAINE 0.2% IN NS 100 ML EPIDURAL INFUSION (ARMC-ANES)
EPIDURAL | Status: AC
  Administered 2016-05-05: 12 mL/h via EPIDURAL
  Filled 2016-05-05: qty 100

## 2016-05-05 MED ORDER — LIDOCAINE HCL (PF) 1 % IJ SOLN
INTRAMUSCULAR | Status: AC
  Filled 2016-05-05: qty 30

## 2016-05-05 MED ORDER — ONDANSETRON HCL 4 MG/2ML IJ SOLN: 4.0000 mg | Freq: Four times a day (QID) | INTRAMUSCULAR | Status: DC | PRN

## 2016-05-05 MED ORDER — LACTATED RINGERS IV SOLN
INTRAVENOUS | Status: DC
  Administered 2016-05-05 – 2016-05-06 (×3): via INTRAVENOUS

## 2016-05-05 MED ORDER — BUPIVACAINE HCL (PF) 0.25 % IJ SOLN
INTRAMUSCULAR | Status: DC | PRN
  Administered 2016-05-05: 2 mL via EPIDURAL
  Administered 2016-05-05: 5 mL via EPIDURAL

## 2016-05-05 MED ORDER — TERBUTALINE SULFATE 1 MG/ML IJ SOLN: 0.2500 mg | Freq: Once | INTRAMUSCULAR | Status: DC | PRN

## 2016-05-05 MED ORDER — LIDOCAINE HCL (PF) 1 % IJ SOLN: 30.0000 mL | INTRAMUSCULAR | Status: DC | PRN

## 2016-05-05 MED ORDER — AMMONIA AROMATIC IN INHA
RESPIRATORY_TRACT | Status: AC
  Filled 2016-05-05: qty 10

## 2016-05-05 MED ORDER — LIDOCAINE-EPINEPHRINE (PF) 1.5 %-1:200000 IJ SOLN
INTRAMUSCULAR | Status: DC | PRN
  Administered 2016-05-05: 2 mL via EPIDURAL

## 2016-05-05 MED ORDER — ACETAMINOPHEN 500 MG PO TABS: 1000.0000 mg | ORAL_TABLET | ORAL | Status: DC | PRN

## 2016-05-05 MED ORDER — SOD CITRATE-CITRIC ACID 500-334 MG/5ML PO SOLN
30.0000 mL | ORAL | Status: DC | PRN
  Administered 2016-05-05: 30 mL via ORAL
  Filled 2016-05-05: qty 30

## 2016-05-05 NOTE — H&P (Addendum)
OB History & Physical   History of Present Illness:  Chief Complaint:   HPI:  Kaitlyn Dalton is a 30 y.o. G2P1001 female at 7562w4d dated by 1st trimester ultrasound with EDC of 05/01/16.  She presents to L&D for induction of labor  +FM, +CTX, no LOF, no VB  Pregnancy Issues: 1. History of prior cesarean for suspected macrosomia (8lb 12oz) 2. Hypothyroid - on synthroid 100mcg  3. Obese - BMI 33 4. Anxiety - on buspar 7.5mg  daily 5. Mechanical IOL via foley bulb x 48hrs as outpatient  Maternal Medical History:   Past Medical History  Diagnosis Date  . Hypothyroidism   . Anxiety     Past Surgical History  Procedure Laterality Date  . Cesarean section      No Known Allergies  Prior to Admission medications   Medication Sig Start Date End Date Taking? Authorizing Provider  busPIRone (BUSPAR) 7.5 MG tablet Take 7.5 mg by mouth 3 (three) times daily.   Yes Historical Provider, MD  levothyroxine (SYNTHROID, LEVOTHROID) 100 MCG tablet Take 100 mcg by mouth daily before breakfast.   Yes Historical Provider, MD  Prenatal Vit-Fe Fumarate-FA (PRENATAL MULTIVITAMIN) TABS tablet Take 1 tablet by mouth daily at 12 noon.   Yes Historical Provider, MD     Prenatal care site: [facility]   Social History: She  reports that she has never smoked. She does not have any smokeless tobacco history on file. She reports that she does not drink alcohol or use illicit drugs.  Family History: family history includes Diabetes in her father; Hypertension in her father and mother; Hypothyroidism in her mother and sister; Lupus in her sister.   Review of Systems: Negative x 10 systems reviewed except as noted in the HPI.     Physical Exam:  Vital Signs: BP 126/82 mmHg  Pulse 93  Temp(Src) 98.6 F (37 C) (Oral)  Resp 18  Ht 5' 9.5" (1.765 m)  Wt 111.131 kg (245 lb)  BMI 35.67 kg/m2 General: no acute distress.  HEENT: normocephalic, atraumatic Heart: regular rate & rhythm.  No  murmurs/rubs/gallops Lungs: clear to auscultation bilaterally, normal respiratory effort Abdomen: soft, gravid, non-tender;  EFW: 8.4 Pelvic:   External: Normal external female genitalia  Cervix: Dilation: 3.5 / 80% /   foley bulb in place, station not able to be assessed.   Extremities: non-tender, symmetric, 2+ edema bilaterally.  DTRs: 2+  Neurologic: Alert & oriented x 3.    No results found for this or any previous visit (from the past 24 hour(s)).  Pertinent Results:  Prenatal Labs: Blood type/Rh A+  Antibody screen neg  Rubella Immune  Varicella Immune  RPR NR  HBsAg Neg  HIV NR  GC neg  Chlamydia neg  Genetic screening negative  1 hour GTT 94  3 hour GTT --  GBS neg   FHT: 130 mod + accels no decels TOCO: q2-4 min SVE: 3-4/80/foley bulb in place   Cephalic by leopolds  Assessment:  Kaitlyn Dalton is a 30 y.o. 12P1001 female at 7562w4d with IOL for past due dates and TOLAC.   Plan:  1. Admit to Labor & Delivery 2. CBC, T&S, Clrs, IVF 3. GBS neg 4. Consents for TOLAC and delivery obtained. 5. Continuous efm/toco 6. Start pitocin against foley bulb.  Will remove FB once admitted and labs sent.    ----- Ranae Plumberhelsea Ward, MD Attending Obstetrician and Gynecologist [facility]

## 2016-05-05 NOTE — Progress Notes (Signed)
Intrapartum progress note  S: patient comfortable feeling contractions, not painful.  Sitting in rocking chair.  O: BP 126/82 mmHg  Pulse 93  Temp(Src) 98.6 F (37 C) (Oral)  Resp 18   FHT: 120 mod +accels no decels TOCO: q 2-594min pit @ 6 SVE:  Foley bulb removed.  5/70/-2, AROM'd for clear fluid  A/P: 30yo G2P1001 @ 40.4 with IOL for past due date and TOLAC  1. After mechanical dilation with foley bulb + pitocin, patient now at a stretchy 5cm.  Continue pitocin. 2. IUP category 1   ----- Ranae Plumberhelsea Ward, MD Attending Obstetrician and Gynecologist [facility]

## 2016-05-05 NOTE — Anesthesia Preprocedure Evaluation (Addendum)
Anesthesia Evaluation  Patient identified by MRN, date of birth, ID band Patient awake    Reviewed: Allergy & Precautions, NPO status , Patient's Chart, lab work & pertinent test results  Airway Mallampati: II       Dental  (+) Teeth Intact   Pulmonary neg pulmonary ROS,    Pulmonary exam normal        Cardiovascular negative cardio ROS   Rhythm:Regular     Neuro/Psych negative neurological ROS     GI/Hepatic negative GI ROS, Neg liver ROS,   Endo/Other  Hypothyroidism Morbid obesity  Renal/GU negative Renal ROS  negative genitourinary   Musculoskeletal   Abdominal (+) + obese,   Peds negative pediatric ROS (+)  Hematology   Anesthesia Other Findings   Reproductive/Obstetrics                            Anesthesia Physical Anesthesia Plan  ASA: II  Anesthesia Plan: Epidural   Post-op Pain Management:    Induction:   Airway Management Planned: Natural Airway  Additional Equipment:   Intra-op Plan:   Post-operative Plan:   Informed Consent: I have reviewed the patients History and Physical, chart, labs and discussed the procedure including the risks, benefits and alternatives for the proposed anesthesia with the patient or authorized representative who has indicated his/her understanding and acceptance.     Plan Discussed with:   Anesthesia Plan Comments:         Anesthesia Quick Evaluation

## 2016-05-05 NOTE — Anesthesia Procedure Notes (Signed)
Epidural  Start time: 05/05/2016 10:23 PM End time: 05/05/2016 10:43 PM  Staffing Anesthesiologist: Elijio MilesVAN STAVEREN, Pietrina Jagodzinski F Performed by: anesthesiologist   Preanesthetic Checklist Completed: patient identified, site marked, surgical consent, pre-op evaluation, timeout performed, IV checked, risks and benefits discussed and monitors and equipment checked  Epidural Patient position: sitting Prep: Betadine Patient monitoring: heart rate and blood pressure Approach: midline Location: L3-L4 Injection technique: LOR air and LOR saline  Needle:  Needle type: Tuohy  Needle gauge: 18 G Needle length: 9 cm Needle insertion depth: 5 cm Catheter type: closed end flexible Catheter size: 20 Guage Catheter at skin depth: 10 cm Test dose: 1.5% lidocaine with Epi 1:200 K  Assessment Sensory level: T8

## 2016-05-06 ENCOUNTER — Inpatient Hospital Stay: Admission: RE | Admit: 2016-05-06 | Payer: 59 | Source: Ambulatory Visit | Admitting: Obstetrics and Gynecology

## 2016-05-06 ENCOUNTER — Encounter: Admission: RE | Payer: Self-pay | Source: Ambulatory Visit

## 2016-05-06 DIAGNOSIS — F32A Depression, unspecified: Secondary | ICD-10-CM | POA: Diagnosis present

## 2016-05-06 DIAGNOSIS — F419 Anxiety disorder, unspecified: Secondary | ICD-10-CM

## 2016-05-06 DIAGNOSIS — F329 Major depressive disorder, single episode, unspecified: Secondary | ICD-10-CM | POA: Diagnosis present

## 2016-05-06 DIAGNOSIS — E039 Hypothyroidism, unspecified: Secondary | ICD-10-CM | POA: Diagnosis present

## 2016-05-06 DIAGNOSIS — O99210 Obesity complicating pregnancy, unspecified trimester: Secondary | ICD-10-CM | POA: Diagnosis present

## 2016-05-06 DIAGNOSIS — O34219 Maternal care for unspecified type scar from previous cesarean delivery: Secondary | ICD-10-CM | POA: Diagnosis not present

## 2016-05-06 LAB — RPR: RPR Ser Ql: NONREACTIVE

## 2016-05-06 SURGERY — Surgical Case
Anesthesia: Choice

## 2016-05-06 MED ORDER — DIPHENHYDRAMINE HCL 25 MG PO CAPS: 25.0000 mg | ORAL_CAPSULE | Freq: Four times a day (QID) | ORAL | Status: DC | PRN

## 2016-05-06 MED ORDER — ONDANSETRON HCL 4 MG/2ML IJ SOLN: 4.0000 mg | INTRAMUSCULAR | Status: DC | PRN

## 2016-05-06 MED ORDER — ACETAMINOPHEN 500 MG PO TABS
1000.0000 mg | ORAL_TABLET | Freq: Four times a day (QID) | ORAL | Status: DC
  Administered 2016-05-06 – 2016-05-07 (×4): 1000 mg via ORAL
  Filled 2016-05-06 (×4): qty 2

## 2016-05-06 MED ORDER — IBUPROFEN 600 MG PO TABS
600.0000 mg | ORAL_TABLET | Freq: Four times a day (QID) | ORAL | Status: DC
  Administered 2016-05-06 – 2016-05-07 (×5): 600 mg via ORAL
  Filled 2016-05-06 (×4): qty 1

## 2016-05-06 MED ORDER — LEVOTHYROXINE SODIUM 50 MCG PO TABS
100.0000 ug | ORAL_TABLET | Freq: Every day | ORAL | Status: DC
  Administered 2016-05-07: 100 ug via ORAL
  Filled 2016-05-06: qty 2

## 2016-05-06 MED ORDER — IBUPROFEN 600 MG PO TABS
ORAL_TABLET | ORAL | Status: AC
  Administered 2016-05-06: 600 mg via ORAL
  Filled 2016-05-06: qty 1

## 2016-05-06 MED ORDER — COCONUT OIL OIL
1.0000 | TOPICAL_OIL | Status: DC | PRN
Start: 2016-05-06 — End: 2016-05-07
  Administered 2016-05-07: 1 via TOPICAL
  Filled 2016-05-06: qty 120

## 2016-05-06 MED ORDER — LACTATED RINGERS IV SOLN: 500.0000 mL | Freq: Once | INTRAVENOUS | Status: DC

## 2016-05-06 MED ORDER — SIMETHICONE 80 MG PO CHEW: 80.0000 mg | CHEWABLE_TABLET | ORAL | Status: DC | PRN

## 2016-05-06 MED ORDER — BENZOCAINE-MENTHOL 20-0.5 % EX AERO
1.0000 "application " | INHALATION_SPRAY | CUTANEOUS | Status: DC | PRN
  Administered 2016-05-06: 1 via TOPICAL
  Filled 2016-05-06: qty 56

## 2016-05-06 MED ORDER — PRENATAL MULTIVITAMIN CH
1.0000 | ORAL_TABLET | Freq: Every day | ORAL | Status: DC
  Administered 2016-05-07: 1 via ORAL
  Filled 2016-05-06 (×2): qty 1

## 2016-05-06 MED ORDER — DIBUCAINE 1 % RE OINT: 1.0000 "application " | TOPICAL_OINTMENT | RECTAL | Status: DC | PRN

## 2016-05-06 MED ORDER — FENTANYL 2.5 MCG/ML W/ROPIVACAINE 0.2% IN NS 100 ML EPIDURAL INFUSION (ARMC-ANES): 12.0000 mL/h | EPIDURAL | Status: DC

## 2016-05-06 MED ORDER — WITCH HAZEL-GLYCERIN EX PADS: 1.0000 "application " | MEDICATED_PAD | CUTANEOUS | Status: DC | PRN

## 2016-05-06 MED ORDER — BUSPIRONE HCL 5 MG PO TABS
7.5000 mg | ORAL_TABLET | Freq: Three times a day (TID) | ORAL | Status: DC
  Filled 2016-05-06: qty 1.5

## 2016-05-06 MED ORDER — ONDANSETRON HCL 4 MG PO TABS: 4.0000 mg | ORAL_TABLET | ORAL | Status: DC | PRN

## 2016-05-06 MED ORDER — PHENYLEPHRINE 40 MCG/ML (10ML) SYRINGE FOR IV PUSH (FOR BLOOD PRESSURE SUPPORT)
80.0000 ug | PREFILLED_SYRINGE | INTRAVENOUS | Status: DC | PRN
  Filled 2016-05-06: qty 5

## 2016-05-06 MED ORDER — BUSPIRONE HCL 15 MG PO TABS
7.5000 mg | ORAL_TABLET | Freq: Every day | ORAL | Status: DC
  Administered 2016-05-07: 7.5 mg via ORAL
  Filled 2016-05-06: qty 1

## 2016-05-06 MED ORDER — DIPHENHYDRAMINE HCL 50 MG/ML IJ SOLN: 12.5000 mg | INTRAMUSCULAR | Status: DC | PRN

## 2016-05-06 MED ORDER — EPHEDRINE 5 MG/ML INJ
10.0000 mg | INTRAVENOUS | Status: DC | PRN
  Filled 2016-05-06: qty 2

## 2016-05-06 MED ORDER — DOCUSATE SODIUM 100 MG PO CAPS
100.0000 mg | ORAL_CAPSULE | Freq: Two times a day (BID) | ORAL | Status: DC
  Administered 2016-05-07 (×2): 100 mg via ORAL
  Filled 2016-05-06 (×2): qty 1

## 2016-05-06 NOTE — Progress Notes (Signed)
Intrapartum progress note  S:  Patient resting comfortably after epidural  O: BP 118/54 mmHg  Pulse 87  Temp(Src) 98.5 F (36.9 C) (Oral)  Resp 18   FHT: 120 mod +accels occasional earlys TOCO: q2-724min, pit at 8 SVE:  5/100/-1  Fetal head well applied  A/P: 30yo G2P1001 @ 40.5 with IOL and TOLAC  1. IOL: good progress on pitocin s/p Foley bulb and AROM.  Continue pitocin and await labor 2. IUP:  Category 1 strip.    ----- Ranae Plumberhelsea Jim Philemon, MD Attending Obstetrician and Gynecologist [facility]

## 2016-05-06 NOTE — Lactation Note (Signed)
This note was copied from a baby's chart. Lactation Consultation Note  Patient Name: Boy Reather LittlerKasey Reczek [id number]'s Date: 05/06/2016 Reason for consult: Difficult latch   Maternal Data Does the patient have breastfeeding experience prior to this delivery?: Yes  Feeding Feeding Type: Breast Milk Length of feed: 5 min (sleepy baby, needed stimulation to suck) Unable to latch baby, flat nipples and sleepy baby, latched with 20 mm nipple shield on right breast, but needed much stimulation to suck, colostrum noted in shield and swallows heard, will initiate pump before next feeding and attempt with shield on left breast   LATCH Score/Interventions Latch: Repeated attempts needed to sustain latch, nipple held in mouth throughout feeding, stimulation needed to elicit sucking reflex. Intervention(s): Skin to skin;Teach feeding cues;Waking techniques Intervention(s): Adjust position;Assist with latch;Breast compression  Audible Swallowing: Spontaneous and intermittent  Type of Nipple: Flat Intervention(s):  (nipple shiield)  Comfort (Breast/Nipple): Soft / non-tender     Hold (Positioning): Full assist, staff holds infant at breast  LATCH Score: 6  Lactation Tools Discussed/Used Tools: Nipple Shields Nipple shield size: 20 WIC Program: No   Consult Status      Dyann KiefMarsha D Samir Ishaq 05/06/2016, 10:44 AM

## 2016-05-06 NOTE — Discharge Instructions (Signed)

## 2016-05-06 NOTE — Progress Notes (Signed)
Patient up to bathroom, cleaned up, and into wheelchair for transfer.  She stated she felt dizzy, used ammonia x 4 to wake. Put back in bed, blood pressures stable.  Continuing to monitor before transfer to floor.

## 2016-05-06 NOTE — Lactation Note (Signed)
This note was copied from a baby's chart. Lactation Consultation Note  Patient Name: Kaitlyn Dalton Reason for consult: Follow-up assessment;Difficult latch   Maternal Data  offered assisting mom to breastfeed in side lying position,  She states her perineal area is too sore, feels like baby does not want to eat., encouraged mom to try again in 1 hr, look for feeding cues and ask for assistance as needed.  Feeding Feeding Type: Breast Milk Length of feed: 1 min (few sucks right breast) Baby just had circ, mucousy, swallowing and gagging on mucous, sleepy, uncoordinated suck LATCH Score/Interventions Latch: Repeated attempts needed to sustain latch, nipple held in mouth throughout feeding, stimulation needed to elicit sucking reflex. Intervention(s): Waking techniques Intervention(s): Adjust position;Assist with latch;Breast massage;Breast compression  Audible Swallowing: None Intervention(s): Hand expression  Type of Nipple: Flat Intervention(s): Double electric pump  Comfort (Breast/Nipple): Soft / non-tender     Hold (Positioning): Assistance needed to correctly position infant at breast and maintain latch.  LATCH Score: 5  Lactation Tools Discussed/Used Tools: Nipple Shields Nipple shield size: 20 Breast pump type: Double-Electric Breast Pump   Consult Status Consult Status: Follow-up Date: 05/07/16 Follow-up type: In-patient    [name], 6:24 PM

## 2016-05-06 NOTE — Discharge Summary (Signed)
Obstetrical Discharge Summary  Patient Name: Kaitlyn Dalton DOB: 05-05-1986 MRN: [id number]  Date of Admission: 05/05/2016 Date of Discharge: 05/07/2016  Primary OB: [facility]  Gestational Age at Delivery: 3365w5d   Antepartum complications: hypothyroid, obesity, history of cesarean, anxiety Admitting Diagnosis: IOL for past due date Secondary Diagnosis: Patient Active Problem List   Diagnosis Date Noted  . Anxiety 05/06/2016  . Hypothyroid 05/06/2016  . Obesity complicating pregnancy 05/06/2016  . Vaginal birth after cesarean delivery 05/06/2016  . Postpartum care following vaginal delivery 05/06/2016    Augmentation: AROM, Pitocin and Foley Balloon Complications: None Intrapartum complications/course: patient was induced via foley bulb with pitocin, and AROM. Epidural without complication. She progressed to complete and had a 30min second stage. Nuchal/body x1, reduced with delivery. Baby placed on mom's chest and after a short delay, cord was doubly clamped and cut by FOB.  Placenta delivered intact, spontaneously, and 2nd degree perineal repair performed.  We sang Happy [place] O[place]irthday to baby [place]. Date of Delivery: 05/06/16 Delivered By: Leeroy Bockhelsea Ward Delivery Type: vaginal birth after cesarean (VBAC) Anesthesia: epidural Placenta: sponatneous Laceration: 2nd degree Episiotomy: none Newborn Data: Live born female  Birth Weight: 8#10.6oz APGAR: 8, 9    Discharge Physical Exam: 05/07/2016  BP 119/75 mmHg  Pulse 85  Temp(Src) 97.5 F (36.4 C) (Oral)  Resp 18  Ht 5' 9.5" (1.765 m)  Wt 111.131 kg (245 lb)  BMI 35.67 kg/m2  SpO2 99%  Breastfeeding? Unknown  General: NAD ABD: s/nd/nt, fundus firm and 2FB below the umbilicus Lochia: moderate DVT Evaluation: LE non-ttp, no evidence of DVT on exam.  HEMOGLOBIN  Date Value Ref Range Status  05/07/2016 9.2* 12.0 - 16.0 g/dL Final   HCT  Date Value Ref Range Status  05/07/2016 27.2* 35.0 - 47.0 % Final    Post partum  course: benign postpartum course. Discharged to home on PPD #1, tolerating a regular diet, voiding without difficulty, and with minimal cramping. Has mild asymptomatic anemia and will be discharged on iron and vitamins. Postpartum Procedures:none Disposition: stable, discharge to home. Baby Feeding: breastmilk Baby Disposition: home with mom  Rh Immune globulin given: n/a Rubella vaccine given: n/a Tdap vaccine given in AP or PP setting: AP Flu vaccine given in AP or PP setting: n/a  Contraception: TBD  Prenatal Labs:  Blood type/Rh A+  Antibody screen neg  Rubella Immune  Varicella Immune  RPR NR  HBsAg Neg  HIV NR  GC neg  Chlamydia neg  Genetic screening negative  1 hour GTT 94  3 hour GTT --  GBS neg          Plan:  Kaitlyn LittlerKasey Dalton was discharged to home in good condition. [facility] with Dr Elesa MassedWard in 6 weeks   Discharge Medications:   Medication List    TAKE these medications        busPIRone 7.5 MG tablet  Commonly known as:  BUSPAR  Take 7.5 mg by mouth 3 (three) times daily.     ibuprofen 600 MG tablet  Commonly known as:  ADVIL,MOTRIN  Take 1 tablet (600 mg total) by mouth every 6 (six) hours as needed.     levothyroxine 100 MCG tablet  Commonly known as:  SYNTHROID, LEVOTHROID  Take 100 mcg by mouth daily before breakfast.     norethindrone 0.35 MG tablet  Commonly known as:  MICRONOR,CAMILA,ERRIN  Take 1 tablet (0.35 mg total) by mouth daily.  Start taking on:  06/19/2016  prenatal multivitamin Tabs tablet  Take 1 tablet by mouth daily at 12 noon.       Patient to pick up some over the counter iron like Slow Fe or ferrous sulfate and take one tab daily. Can also take Colace 100 mgm once or twice daily as needed for constipation. Follow-up Information    Follow up with Elenora Fender Ward, MD In 6 weeks.   Specialty:  Obstetrics and Gynecology   Why:  for routine post partum care   Contact  information:   [address] [place] [address] [phone]       Signed: Farrel Conners, CNM

## 2016-05-07 LAB — CBC
HCT: 27.2 % — ABNORMAL LOW (ref 35.0–47.0)
HEMOGLOBIN: 9.2 g/dL — AB (ref 12.0–16.0)
MCH: 27.9 pg (ref 26.0–34.0)
MCHC: 33.8 g/dL (ref 32.0–36.0)
MCV: 82.5 fL (ref 80.0–100.0)
PLATELETS: 174 10*3/uL (ref 150–440)
RBC: 3.3 MIL/uL — AB (ref 3.80–5.20)
RDW: 14.2 % (ref 11.5–14.5)
WBC: 10.5 10*3/uL (ref 3.6–11.0)

## 2016-05-07 MED ORDER — NORETHINDRONE 0.35 MG PO TABS: 1.0000 | ORAL_TABLET | Freq: Every day | ORAL | Status: DC

## 2016-05-07 MED ORDER — IBUPROFEN 600 MG PO TABS: 600.0000 mg | ORAL_TABLET | Freq: Four times a day (QID) | ORAL | Status: DC | PRN

## 2016-05-07 NOTE — Progress Notes (Signed)
Patient discharged home with infant and significant other. Discharge instructions, prescriptions and follow up appointment given to and reviewed with patient and significant other. Patient verbalized understanding. Escorted out via wheelchair by Vaness Jelinski, RN. 

## 2016-05-12 ENCOUNTER — Encounter: Payer: Self-pay | Admitting: Family Medicine

## 2016-05-19 NOTE — Anesthesia Postprocedure Evaluation (Signed)
Anesthesia Post Note  Patient: Kaitlyn Dalton  Procedure(s) Performed: * No procedures listed *  Comments: Patient unfortunately not evaluated prior to discharge.     Last Vitals:  Filed Vitals:   05/07/16 0810 05/07/16 1141  BP: 119/75   Pulse: 85   Temp: 36.4 C 36.4 C  Resp: 18     Last Pain:  Filed Vitals:   05/07/16 1141  PainSc: 3                  VAN STAVEREN,Devrin Monforte

## 2016-11-17 DIAGNOSIS — E039 Hypothyroidism, unspecified: Secondary | ICD-10-CM | POA: Diagnosis not present

## 2017-02-02 ENCOUNTER — Other Ambulatory Visit: Payer: Self-pay | Admitting: Certified Nurse Midwife

## 2017-02-02 DIAGNOSIS — Z3041 Encounter for surveillance of contraceptive pills: Secondary | ICD-10-CM

## 2017-02-02 MED ORDER — LEVONORGESTREL-ETHINYL ESTRAD 0.1-20 MG-MCG PO TABS: 1.0000 | ORAL_TABLET | Freq: Every day | ORAL | 11 refills | Status: DC

## 2017-02-07 ENCOUNTER — Other Ambulatory Visit: Payer: Self-pay | Admitting: Obstetrics and Gynecology

## 2017-02-07 DIAGNOSIS — F3289 Other specified depressive episodes: Secondary | ICD-10-CM

## 2017-02-07 MED ORDER — BUPROPION HCL ER (XL) 300 MG PO TB24: 300.0000 mg | ORAL_TABLET | Freq: Every morning | ORAL | 1 refills | Status: DC

## 2017-02-07 MED ORDER — BUPROPION HCL ER (XL) 150 MG PO TB24: 150.0000 mg | ORAL_TABLET | Freq: Every day | ORAL | 0 refills | Status: DC

## 2017-03-13 ENCOUNTER — Other Ambulatory Visit: Payer: 59

## 2017-03-13 ENCOUNTER — Other Ambulatory Visit: Payer: Self-pay

## 2017-03-13 ENCOUNTER — Other Ambulatory Visit: Payer: Self-pay | Admitting: Obstetrics and Gynecology

## 2017-03-13 DIAGNOSIS — E039 Hypothyroidism, unspecified: Secondary | ICD-10-CM

## 2017-03-13 NOTE — Addendum Note (Signed)
Addended by: Kathlene Cote on: 03/13/2017 03:51 PM   Modules accepted: Orders

## 2017-03-13 NOTE — Progress Notes (Signed)
sh

## 2017-03-14 LAB — TSH+FREE T4
Free T4: 1.47 ng/dL (ref 0.82–1.77)
TSH: 8.07 u[IU]/mL — ABNORMAL HIGH (ref 0.450–4.500)

## 2017-03-14 MED ORDER — LEVOTHYROXINE SODIUM 100 MCG PO TABS: 100.0000 ug | ORAL_TABLET | Freq: Every day | ORAL | 0 refills | Status: DC

## 2017-04-12 ENCOUNTER — Other Ambulatory Visit: Payer: Self-pay | Admitting: Obstetrics and Gynecology

## 2017-04-12 ENCOUNTER — Other Ambulatory Visit: Payer: 59

## 2017-04-12 DIAGNOSIS — E039 Hypothyroidism, unspecified: Secondary | ICD-10-CM | POA: Diagnosis not present

## 2017-04-12 DIAGNOSIS — F3289 Other specified depressive episodes: Secondary | ICD-10-CM

## 2017-04-12 MED ORDER — BUPROPION HCL ER (XL) 300 MG PO TB24: 300.0000 mg | ORAL_TABLET | Freq: Every morning | ORAL | 5 refills | Status: DC

## 2017-04-13 ENCOUNTER — Other Ambulatory Visit: Payer: Self-pay | Admitting: Obstetrics and Gynecology

## 2017-04-13 DIAGNOSIS — E039 Hypothyroidism, unspecified: Secondary | ICD-10-CM

## 2017-04-13 LAB — TSH+FREE T4
Free T4: 1.44 ng/dL (ref 0.82–1.77)
TSH: 1.77 u[IU]/mL (ref 0.450–4.500)

## 2017-04-13 MED ORDER — LEVOTHYROXINE SODIUM 100 MCG PO TABS: 100.0000 ug | ORAL_TABLET | Freq: Every day | ORAL | 1 refills | Status: DC

## 2017-04-13 NOTE — Progress Notes (Signed)
Pt aware. Rechk labs in 8 wks.

## 2017-04-14 ENCOUNTER — Other Ambulatory Visit: Payer: Self-pay | Admitting: Certified Nurse Midwife

## 2017-04-14 MED ORDER — LEVONORGESTREL-ETHINYL ESTRAD 0.15-30 MG-MCG PO TABS: 1.0000 | ORAL_TABLET | Freq: Every day | ORAL | 3 refills | Status: DC

## 2017-05-09 DIAGNOSIS — H5213 Myopia, bilateral: Secondary | ICD-10-CM | POA: Diagnosis not present

## 2017-06-12 ENCOUNTER — Other Ambulatory Visit: Payer: 59

## 2017-06-12 DIAGNOSIS — E039 Hypothyroidism, unspecified: Secondary | ICD-10-CM

## 2017-06-13 ENCOUNTER — Other Ambulatory Visit: Payer: Self-pay | Admitting: Obstetrics and Gynecology

## 2017-06-13 DIAGNOSIS — E039 Hypothyroidism, unspecified: Secondary | ICD-10-CM

## 2017-06-13 LAB — TSH+FREE T4
FREE T4: 1.51 ng/dL (ref 0.82–1.77)
TSH: 2.86 u[IU]/mL (ref 0.450–4.500)

## 2017-06-13 MED ORDER — LEVOTHYROXINE SODIUM 100 MCG PO TABS: 100.0000 ug | ORAL_TABLET | Freq: Every day | ORAL | 2 refills | Status: DC

## 2017-06-13 NOTE — Progress Notes (Signed)
Pt's TSH WNL but pt feels better when TSH is closer to 1.0. She is feeling sluggish. Increase TSH 100 mg to 2 tabs Wed, Fri and Sundays (was just [place]Wed and Sun). Rechk labs in 3 months.

## 2017-06-22 ENCOUNTER — Other Ambulatory Visit: Payer: Self-pay | Admitting: Obstetrics and Gynecology

## 2017-06-22 MED ORDER — LORAZEPAM 1 MG PO TABS: 1.0000 mg | ORAL_TABLET | Freq: Two times a day (BID) | ORAL | 0 refills | Status: DC

## 2017-06-22 NOTE — Progress Notes (Signed)
Pt needs Rx RF ativan for travel.

## 2017-09-14 ENCOUNTER — Other Ambulatory Visit: Payer: Self-pay | Admitting: Obstetrics and Gynecology

## 2017-09-14 ENCOUNTER — Other Ambulatory Visit: Payer: 59

## 2017-09-14 DIAGNOSIS — E039 Hypothyroidism, unspecified: Secondary | ICD-10-CM

## 2017-09-14 MED ORDER — LEVOTHYROXINE SODIUM 100 MCG PO TABS: 100.0000 ug | ORAL_TABLET | Freq: Every day | ORAL | 0 refills | Status: DC

## 2017-09-14 NOTE — Progress Notes (Signed)
Thyroid labs due. Rx RF till results come back.

## 2017-09-15 LAB — TSH+FREE T4
FREE T4: 1.71 ng/dL (ref 0.82–1.77)
TSH: 0.315 u[IU]/mL — ABNORMAL LOW (ref 0.450–4.500)

## 2017-09-16 ENCOUNTER — Other Ambulatory Visit: Payer: Self-pay | Admitting: Obstetrics and Gynecology

## 2017-09-16 DIAGNOSIS — E039 Hypothyroidism, unspecified: Secondary | ICD-10-CM

## 2017-09-16 NOTE — Progress Notes (Signed)
Pt aware of low TSH. Stop taking 2 tabs Fri. Take 1 tab daily except 2 tabs on Wed and Sun. Rechk in 1 mo.

## 2017-10-12 ENCOUNTER — Other Ambulatory Visit: Payer: Self-pay | Admitting: Obstetrics and Gynecology

## 2017-10-12 DIAGNOSIS — F3289 Other specified depressive episodes: Secondary | ICD-10-CM

## 2017-10-17 ENCOUNTER — Other Ambulatory Visit: Payer: 59

## 2017-10-17 DIAGNOSIS — E039 Hypothyroidism, unspecified: Secondary | ICD-10-CM | POA: Diagnosis not present

## 2017-10-18 ENCOUNTER — Telehealth: Payer: Self-pay | Admitting: Obstetrics and Gynecology

## 2017-10-18 DIAGNOSIS — E039 Hypothyroidism, unspecified: Secondary | ICD-10-CM

## 2017-10-18 LAB — TSH+FREE T4
FREE T4: 1.31 ng/dL (ref 0.82–1.77)
TSH: 1.33 u[IU]/mL (ref 0.450–4.500)

## 2017-10-18 MED ORDER — LEVOTHYROXINE SODIUM 100 MCG PO TABS: 100.0000 ug | ORAL_TABLET | Freq: Every day | ORAL | 5 refills | Status: DC

## 2017-10-18 NOTE — Telephone Encounter (Signed)
Pt aware of normal thyroid labs. Cont levo 100 mcg daily with 2 tabs Wed and Fri. Rx RF. Rechk in 6 months.

## 2017-11-23 ENCOUNTER — Other Ambulatory Visit: Payer: Self-pay | Admitting: Certified Nurse Midwife

## 2017-11-23 MED ORDER — AMOXICILLIN-POT CLAVULANATE 875-125 MG PO TABS: 1.0000 | ORAL_TABLET | Freq: Two times a day (BID) | ORAL | 0 refills | Status: AC

## 2017-12-05 ENCOUNTER — Other Ambulatory Visit: Payer: Self-pay | Admitting: Certified Nurse Midwife

## 2017-12-05 MED ORDER — NYSTATIN 100000 UNIT/GM EX OINT: 1.0000 "application " | TOPICAL_OINTMENT | Freq: Three times a day (TID) | CUTANEOUS | 0 refills | Status: DC | PRN

## 2017-12-05 MED ORDER — FLUCONAZOLE 150 MG PO TABS: ORAL_TABLET | ORAL | 0 refills | Status: DC

## 2018-01-05 ENCOUNTER — Encounter: Payer: Self-pay | Admitting: Internal Medicine

## 2018-01-05 ENCOUNTER — Ambulatory Visit (INDEPENDENT_AMBULATORY_CARE_PROVIDER_SITE_OTHER): Payer: No Typology Code available for payment source | Admitting: Internal Medicine

## 2018-01-05 VITALS — BP 118/86 | HR 87 | Temp 98.7°F | Ht 69.0 in | Wt 220.4 lb

## 2018-01-05 DIAGNOSIS — Z1283 Encounter for screening for malignant neoplasm of skin: Secondary | ICD-10-CM

## 2018-01-05 DIAGNOSIS — R61 Generalized hyperhidrosis: Secondary | ICD-10-CM | POA: Diagnosis not present

## 2018-01-05 DIAGNOSIS — D649 Anemia, unspecified: Secondary | ICD-10-CM | POA: Diagnosis not present

## 2018-01-05 DIAGNOSIS — F329 Major depressive disorder, single episode, unspecified: Secondary | ICD-10-CM

## 2018-01-05 DIAGNOSIS — E039 Hypothyroidism, unspecified: Secondary | ICD-10-CM

## 2018-01-05 DIAGNOSIS — R6882 Decreased libido: Secondary | ICD-10-CM | POA: Diagnosis not present

## 2018-01-05 DIAGNOSIS — E669 Obesity, unspecified: Secondary | ICD-10-CM

## 2018-01-05 DIAGNOSIS — Z1159 Encounter for screening for other viral diseases: Secondary | ICD-10-CM

## 2018-01-05 DIAGNOSIS — F419 Anxiety disorder, unspecified: Secondary | ICD-10-CM

## 2018-01-05 DIAGNOSIS — E559 Vitamin D deficiency, unspecified: Secondary | ICD-10-CM

## 2018-01-05 DIAGNOSIS — R946 Abnormal results of thyroid function studies: Secondary | ICD-10-CM

## 2018-01-05 DIAGNOSIS — F32A Depression, unspecified: Secondary | ICD-10-CM

## 2018-01-05 HISTORY — DX: Generalized hyperhidrosis: R61

## 2018-01-05 LAB — URINALYSIS, ROUTINE W REFLEX MICROSCOPIC
Bilirubin Urine: NEGATIVE
KETONES UR: NEGATIVE
Nitrite: NEGATIVE
PH: 7.5 (ref 5.0–8.0)
Specific Gravity, Urine: 1.01 (ref 1.000–1.030)
Total Protein, Urine: NEGATIVE
URINE GLUCOSE: NEGATIVE
UROBILINOGEN UA: 0.2 (ref 0.0–1.0)

## 2018-01-05 LAB — COMPREHENSIVE METABOLIC PANEL
ALBUMIN: 4.5 g/dL (ref 3.5–5.2)
ALT: 19 U/L (ref 0–35)
AST: 19 U/L (ref 0–37)
Alkaline Phosphatase: 85 U/L (ref 39–117)
BUN: 10 mg/dL (ref 6–23)
CALCIUM: 9.5 mg/dL (ref 8.4–10.5)
CHLORIDE: 102 meq/L (ref 96–112)
CO2: 29 meq/L (ref 19–32)
CREATININE: 0.84 mg/dL (ref 0.40–1.20)
GFR: 84.12 mL/min (ref >60.00)
Glucose, Bld: 83 mg/dL (ref 70–99)
POTASSIUM: 3.7 meq/L (ref 3.5–5.1)
Sodium: 136 mEq/L (ref 135–145)
Total Bilirubin: 0.4 mg/dL (ref 0.2–1.2)
Total Protein: 7.9 g/dL (ref 6.0–8.3)

## 2018-01-05 LAB — TSH: TSH: 2.03 u[IU]/mL (ref 0.35–4.50)

## 2018-01-05 LAB — T4, FREE: FREE T4: 0.92 ng/dL (ref 0.60–1.60)

## 2018-01-05 LAB — VITAMIN D 25 HYDROXY (VIT D DEFICIENCY, FRACTURES): VITD: 33.25 ng/mL (ref 30.00–100.00)

## 2018-01-05 LAB — T3, FREE: T3 FREE: 3.4 pg/mL (ref 2.3–4.2)

## 2018-01-05 MED ORDER — PHENTERMINE HCL 37.5 MG PO TABS: 37.5000 mg | ORAL_TABLET | Freq: Every day | ORAL | 0 refills | Status: DC

## 2018-01-05 MED ORDER — ALUMINUM CHLORIDE 20 % EX SOLN: CUTANEOUS | 11 refills | Status: DC

## 2018-01-05 MED ORDER — BUSPIRONE HCL 10 MG PO TABS: 10.0000 mg | ORAL_TABLET | Freq: Two times a day (BID) | ORAL | 5 refills | Status: DC

## 2018-01-05 NOTE — Progress Notes (Signed)
Pre visit review using our clinic review tool, if applicable. No additional management support is needed unless otherwise documented below in the visit note. 

## 2018-01-05 NOTE — Patient Instructions (Addendum)
Follow up in 1 months  Labs and urine today  Disc with Levin Erp and female hormones.  Referred to Dr. Roseanne Kaufman skin check  Consider mindfulness and meditation for anxiety Apps free for phone insight timer, headspace, calm  Try Zeasorb AF to feet during the day to keep dry this is over the counter  Try Drysol 1x at bedtime hands and feet. Once excess sweating stopped decrease to 1-2x weekly.      Generalized Anxiety Disorder, Adult Generalized anxiety disorder (GAD) is a mental health disorder. People with this condition constantly worry about everyday events. Unlike normal anxiety, worry related to GAD is not triggered by a specific event. These worries also do not fade or get better with time. GAD interferes with life functions, including relationships, work, and school. GAD can vary from mild to severe. People with severe GAD can have intense waves of anxiety with physical symptoms (panic attacks). What are the causes? The exact cause of GAD is not known. What increases the risk? This condition is more likely to develop in:  Women.  People who have a family history of anxiety disorders.  People who are very shy.  People who experience very stressful life events, such as the death of a loved one.  People who have a very stressful family environment.  What are the signs or symptoms? People with GAD often worry excessively about many things in their lives, such as their health and family. They may also be overly concerned about:  Doing well at work.  Being on time.  Natural disasters.  Friendships.  Physical symptoms of GAD include:  Fatigue.  Muscle tension or having muscle twitches.  Trembling or feeling shaky.  Being easily startled.  Feeling like your heart is pounding or racing.  Feeling out of breath or like you cannot take a deep breath.  Having trouble falling asleep or staying asleep.  Sweating.  Nausea, diarrhea, or irritable bowel syndrome  (IBS).  Headaches.  Trouble concentrating or remembering facts.  Restlessness.  Irritability.  How is this diagnosed? Your health care provider can diagnose GAD based on your symptoms and medical history. You will also have a physical exam. The health care provider will ask specific questions about your symptoms, including how severe they are, when they started, and if they come and go. Your health care provider may ask you about your use of alcohol or drugs, including prescription medicines. Your health care provider may refer you to a mental health specialist for further evaluation. Your health care provider will do a thorough examination and may perform additional tests to rule out other possible causes of your symptoms. To be diagnosed with GAD, a person must have anxiety that:  Is out of his or her control.  Affects several different aspects of his or her life, such as work and relationships.  Causes distress that makes him or her unable to take part in normal activities.  Includes at least three physical symptoms of GAD, such as restlessness, fatigue, trouble concentrating, irritability, muscle tension, or sleep problems.  Before your health care provider can confirm a diagnosis of GAD, these symptoms must be present more days than they are not, and they must last for six months or longer. How is this treated? The following therapies are usually used to treat GAD:  Medicine. Antidepressant medicine is usually prescribed for long-term daily control. Antianxiety medicines may be added in severe cases, especially when panic attacks occur.  Talk therapy (psychotherapy). Certain types of  talk therapy can be helpful in treating GAD by providing support, education, and guidance. Options include: ? Cognitive behavioral therapy (CBT). People learn coping skills and techniques to ease their anxiety. They learn to identify unrealistic or negative thoughts and behaviors and to replace them  with positive ones. ? Acceptance and commitment therapy (ACT). This treatment teaches people how to be mindful as a way to cope with unwanted thoughts and feelings. ? Biofeedback. This process trains you to manage your body's response (physiological response) through breathing techniques and relaxation methods. You will work with a therapist while machines are used to monitor your physical symptoms.  Stress management techniques. These include yoga, meditation, and exercise.  A mental health specialist can help determine which treatment is best for you. Some people see improvement with one type of therapy. However, other people require a combination of therapies. Follow these instructions at home:  Take over-the-counter and prescription medicines only as told by your health care provider.  Try to maintain a normal routine.  Try to anticipate stressful situations and allow extra time to manage them.  Practice any stress management or self-calming techniques as taught by your health care provider.  Do not punish yourself for setbacks or for not making progress.  Try to recognize your accomplishments, even if they are small.  Keep all follow-up visits as told by your health care provider. This is important. Contact a health care provider if:  Your symptoms do not get better.  Your symptoms get worse.  You have signs of depression, such as: ? A persistently sad, cranky, or irritable mood. ? Loss of enjoyment in activities that used to bring you joy. ? Change in weight or eating. ? Changes in sleeping habits. ? Avoiding friends or family members. ? Loss of energy for normal tasks. ? Feelings of guilt or worthlessness. Get help right away if:  You have serious thoughts about hurting yourself or others. If you ever feel like you may hurt yourself or others, or have thoughts about taking your own life, get help right away. You can go to your nearest emergency department or  call:  Your local emergency services (911 in the [place]  A suicide crisis helpline, such as the National Suicide Prevention Lifeline at 64015385031-3326839073. This is open 24 hours a day.  Summary  Generalized anxiety disorder (GAD) is a mental health disorder that involves worry that is not triggered by a specific event.  People with GAD often worry excessively about many things in their lives, such as their health and family.  GAD may cause physical symptoms such as restlessness, trouble concentrating, sleep problems, frequent sweating, nausea, diarrhea, headaches, and trembling or muscle twitching.  A mental health specialist can help determine which treatment is best for you. Some people see improvement with one type of therapy. However, other people require a combination of therapies. This information is not intended to replace advice given to you by your health care provider. Make sure you discuss any questions you have with your health care provider. Document Released: 03/11/2013 Document Revised: 10/04/2016 Document Reviewed: 10/04/2016 Elsevier Interactive Patient Education  [employer]2018 [employer] Inc.

## 2018-01-05 NOTE — Progress Notes (Signed)
Chief Complaint  Patient presents with  . Establish Care   Establish care  1. Anxiety/depression with FH. She reports she does not want to be around people at times, has had anxiety since age 32 y.o. At times angry and anxious more than depressed and easily frustrated. She does no know the triggers she has everything in life. Sx's worse since had 61 month old son. She has seen therapy at [facility] in the past did not find if helpful. She has been on Cymbalta which did ok, lexapro did not like, zoloft made her feel empty. She does not want to try any of these medication for now due to c/w low libido which she already has. She does report she is irritable around menses and also OCD and with season changes she is moody  PHQ 9 today 7. She has tried Orthoptist, prayer but no help and speeding ticket recently made worse anxiety  2. Low sex drive interested in hormonal testing will defer to [facility] she sees Levin Erp  3. C/o excess sweating to hands and feet since a kid nothing tried  4. Obesity wants to lose wt though trying to eat healthy and not exercising as much in summer when exercised lost 13 lbs  Wants adipex trial again used in the past mom h/o gastric bypass and she does not want to get to this pt 5.hypothyroidism and abnormal tfts with strong FH thyroid d/o was on Armour thyroid now on Synthyroid 100 mg most days except 200 mg on W, Sunday      Review of Systems  Constitutional: Negative for weight loss.  HENT: Negative for hearing loss.   Eyes:       No vision changes   Respiratory: Negative for shortness of breath.   Cardiovascular: Negative for chest pain.  Gastrointestinal: Negative for abdominal pain.  Genitourinary:       +low libido  Musculoskeletal: Negative for falls.  Skin: Negative for rash.  Neurological: Negative for headaches.  Psychiatric/Behavioral: Negative for depression. The patient is nervous/anxious.        +stress    Past Medical History:  Diagnosis  Date  . Anxiety   . Depression   . Hypothyroidism   . Obesity   . Thyroid disease    Past Surgical History:  Procedure Laterality Date  . CESAREAN SECTION  07/2008  . CESAREAN SECTION     Family History  Problem Relation Age of Onset  . Hyperlipidemia Mother   . Depression Mother   . Hypertension Mother   . Hypothyroidism Mother   . Hypertension Father   . Diabetes Father   . Lupus Sister   . Hypothyroidism Sister    Social History   Socioeconomic History  . Marital status: Married    Spouse name: Not on file  . Number of children: 1  . Years of education: Not on file  . Highest education level: Not on file  Social Needs  . Financial resource strain: Not on file  . Food insecurity - worry: Not on file  . Food insecurity - inability: Not on file  . Transportation needs - medical: Not on file  . Transportation needs - non-medical: Not on file  Occupational History  . Occupation: Associate Professor at [employer] [place]  Tobacco Use  . Smoking status: Never Smoker  Substance and Sexual Activity  . Alcohol use: No  . Drug use: No  . Sexual activity: Yes    Birth control/protection:  Pill  Other Topics Concern  . Not on file  Social History Narrative   ** Merged History Encounter **       Current Meds  Medication Sig  . buPROPion (WELLBUTRIN XL) 300 MG 24 hr tablet TAKE 1 TABLET (300 MG TOTAL) BY MOUTH EVERY MORNING.  . busPIRone (BUSPAR) 10 MG tablet Take 1 tablet (10 mg total) by mouth 2 (two) times daily.  Marland Kitchen ibuprofen (ADVIL,MOTRIN) 600 MG tablet Take 1 tablet (600 mg total) by mouth every 6 (six) hours as needed.  Marland Kitchen levonorgestrel-ethinyl estradiol (PORTIA-28) 0.15-30 MG-MCG tablet Take 1 tablet by mouth daily.  Marland Kitchen levothyroxine (SYNTHROID, LEVOTHROID) 100 MCG tablet Take 1 tablet (100 mcg total) by mouth daily before breakfast. Except 2 tabs on Wednesdays and Sundays  . LORazepam (ATIVAN) 1 MG tablet Take 1 tablet (1 mg total) by mouth 2 (two) times  daily.  . [DISCONTINUED] busPIRone (BUSPAR) 7.5 MG tablet Take 7.5 mg by mouth 3 (three) times daily.   No Known Allergies Recent Results (from the past 2160 hour(s))  TSH + free T4     Status: None   Collection Time: 10/17/17 10:14 AM  Result Value Ref Range   TSH 1.330 0.450 - 4.500 uIU/mL   Free T4 1.31 0.82 - 1.77 ng/dL   Objective  Body mass index is 32.55 kg/m. Wt Readings from Last 3 Encounters:  01/05/18 220 lb 6.4 oz (100 kg)  05/05/16 245 lb (111.1 kg)  07/15/13 204 lb (92.5 kg)   Temp Readings from Last 3 Encounters:  01/05/18 98.7 F (37.1 C) (Oral)  05/07/16 97.6 F (36.4 C) (Oral)  07/15/13 98 F (36.7 C)   BP Readings from Last 3 Encounters:  01/05/18 118/86  05/07/16 119/75  07/15/13 104/70   Pulse Readings from Last 3 Encounters:  01/05/18 87  05/07/16 85  07/15/13 90   O2 sat room air 98%  Physical Exam  Constitutional: She is oriented to person, place, and time and well-developed, well-nourished, and in no distress. Vital signs are normal.  HENT:  Head: Normocephalic and atraumatic.  Mouth/Throat: Oropharynx is clear and moist and mucous membranes are normal.  Large tonsils   Eyes: Conjunctivae are normal. Pupils are equal, round, and reactive to light.  Cardiovascular: Normal rate, regular rhythm and normal heart sounds.  Pulmonary/Chest: Effort normal and breath sounds normal.  Abdominal: Soft. Bowel sounds are normal.  Neurological: She is alert and oriented to person, place, and time. Gait normal. Gait normal.  Skin: Skin is warm, dry and intact.  Psychiatric: Mood, memory, affect and judgment normal.  Nursing note and vitals reviewed.   Assessment   1. Anxiety/depression PHQ 9 7 today  2. Low sex drive interested in hormonal testing will defer to [facility] she sees Levin Erp  3. Hyperhidrosis 4. Obesity  5.hypothyroidism and abnormal tfts with strong FH thyroid d/o  6. HM  Plan  1. Cont wellbutrin same dose does not  want SSRI 2/2 c/w reduced libido side effects  Increase Buspar 10 mg bid was on 7.5 mg qd to bid  Check labs today CMET, CBC, anemia, Vit D, TSH, T4/3, anti TPO (of note never had thyroid US), UA, hep B titer willl need to do lipid in future  2.  Defer to Levin Erp if needs hormone testing for this  Disc sex therapy stress reduction  3.  Trial Drysol, Zeasorb AF 4. Adipex x 1 month f/u in 1 month healthy diet and exercise  5.  Complete tfts  was on Armour thyroid now on Synthyroid 100 mg most days except 200 mg on W, Sunday  Consider thyroid US in future never had if tfts abnormal  Consider back to Dr. Tedd SiasSolum   6.  Had flu shot 09/03/17  Tdap in 2017  Had HPV vaccine   Pap [facility] 08/2015 get records.  Referred Dr. Roseanne KaufmanIsenstein tbse check mole left breast.  Provider: Dr. French Anaracy [facility]

## 2018-01-08 LAB — CBC WITH DIFFERENTIAL/PLATELET
BASOS ABS: 58 {cells}/uL (ref 0–200)
Basophils Relative: 1.1 %
EOS ABS: 42 {cells}/uL (ref 15–500)
Eosinophils Relative: 0.8 %
HEMATOCRIT: 39.6 % (ref 35.0–45.0)
HEMOGLOBIN: 12.8 g/dL (ref 11.7–15.5)
Lymphs Abs: 2549 cells/uL (ref 850–3900)
MCH: 26.7 pg — AB (ref 27.0–33.0)
MCHC: 32.3 g/dL (ref 32.0–36.0)
MCV: 82.5 fL (ref 80.0–100.0)
MPV: 10.7 fL (ref 7.5–12.5)
Monocytes Relative: 9 %
Neutro Abs: 2173 cells/uL (ref 1500–7800)
Neutrophils Relative %: 41 %
Platelets: 327 10*3/uL (ref 140–400)
RBC: 4.8 10*6/uL (ref 3.80–5.10)
RDW: 12.8 % (ref 11.0–15.0)
Total Lymphocyte: 48.1 %
WBC mixed population: 477 cells/uL (ref 200–950)
WBC: 5.3 10*3/uL (ref 3.8–10.8)

## 2018-01-08 LAB — IRON,TIBC AND FERRITIN PANEL
%SAT: 20 % (ref 11–50)
Ferritin: 8 ng/mL — ABNORMAL LOW (ref 10–154)
IRON: 92 ug/dL (ref 40–190)
TIBC: 471 ug/dL — AB (ref 250–450)

## 2018-01-08 LAB — THYROID PEROXIDASE ANTIBODY: Thyroperoxidase Ab SerPl-aCnc: 113 IU/mL — ABNORMAL HIGH (ref <9)

## 2018-01-08 LAB — HEPATITIS B SURFACE ANTIBODY, QUANTITATIVE: Hepatitis B-Post: 28 m[IU]/mL (ref >=10)

## 2018-01-10 ENCOUNTER — Encounter: Payer: Self-pay | Admitting: Internal Medicine

## 2018-01-14 ENCOUNTER — Other Ambulatory Visit: Payer: Self-pay | Admitting: Internal Medicine

## 2018-01-14 DIAGNOSIS — E039 Hypothyroidism, unspecified: Secondary | ICD-10-CM

## 2018-01-30 ENCOUNTER — Ambulatory Visit (INDEPENDENT_AMBULATORY_CARE_PROVIDER_SITE_OTHER): Payer: No Typology Code available for payment source | Admitting: Obstetrics and Gynecology

## 2018-01-30 ENCOUNTER — Encounter: Payer: Self-pay | Admitting: Obstetrics and Gynecology

## 2018-01-30 VITALS — BP 130/82 | HR 91 | Ht 69.0 in | Wt 214.0 lb

## 2018-01-30 DIAGNOSIS — N926 Irregular menstruation, unspecified: Secondary | ICD-10-CM | POA: Diagnosis not present

## 2018-01-30 DIAGNOSIS — Z13220 Encounter for screening for lipoid disorders: Secondary | ICD-10-CM

## 2018-01-30 DIAGNOSIS — R6882 Decreased libido: Secondary | ICD-10-CM | POA: Diagnosis not present

## 2018-01-30 MED ORDER — NORETHIN-ETH ESTRAD-FE BIPHAS 1 MG-10 MCG / 10 MCG PO TABS: 1.0000 | ORAL_TABLET | Freq: Every day | ORAL | 0 refills | Status: DC

## 2018-01-30 NOTE — Patient Instructions (Signed)
I value your feedback and entrusting us with your care. If you get a [facility] patient survey, I would appreciate you taking the time to let us know about your experience today. Thank you! 

## 2018-01-30 NOTE — Progress Notes (Signed)
Chief Complaint  Patient presents with  . Referral    Follow-up pcp    HPI:      [name] is a 32 y.o. Kaitlyn Dalton who LMP was Patient's last menstrual period was 01/24/2018 (exact date)., presents today for decreased libido discussion, referred by PCP. Pt notes decreased libido, particularly since birth of last son. Pt and husband are working on it and relationship is good otherwise. PCP suggested hormone testing. Pt upset because she doesn't think this should be an issue at her age. Pt on 30 mcg estrogen OCP. Had BTB with aviane (20 mcg) in the past. Has had breast tenderness, water retention with current pill.   Thyroid level is good. Seeing endocrine per PCP in a few wks. Pt on wellbutrin and buspar for depression and anxiety sx. Depression sx improved but anxiety persists. Pt declines SSRI due to decreased libido side effects, plus didn't feel well on a couple of them. Wants to cont current meds regimen.   Pt also needs fasting lipids done.   Past Medical History:  Diagnosis Date  . Anxiety   . Depression   . Hypothyroidism   . Obesity   . Thyroid disease     Past Surgical History:  Procedure Laterality Date  . CESAREAN SECTION  07/2008  . CESAREAN SECTION      Family History  Problem Relation Age of Onset  . Hyperlipidemia Mother   . Depression Mother   . Hypertension Mother   . Hypothyroidism Mother   . Thyroid disease Mother   . Hypertension Father   . Diabetes Father   . Asthma Father   . Heart disease Father   . Hyperlipidemia Father   . Lupus Sister   . Hypothyroidism Sister   . Asthma Sister   . Miscarriages / Stillbirths Sister   . Depression Son   . Heart disease Maternal Grandmother   . Dementia Maternal Grandmother   . Thyroid disease Maternal Grandmother   . Thyroid disease Maternal Grandfather   . Cancer Paternal Grandfather        skin colon  . Hashimoto's thyroiditis Sister   . Lupus Sister     Social History   Socioeconomic  History  . Marital status: Married    Spouse name: Kaitlyn Dalton  . Number of children: 1  . Years of education: Kaitlyn Dalton  . Highest education level: Kaitlyn Dalton  Social Needs  . Financial resource strain: Kaitlyn Dalton  . Food insecurity - worry: Kaitlyn Dalton  . Food insecurity - inability: Kaitlyn Dalton  . Transportation needs - medical: Kaitlyn Dalton  . Transportation needs - non-medical: Kaitlyn Dalton  Occupational History  . Occupation: Kaitlyn Dalton    Employer: Kait[employer] Dalton  Tobacco Use  . Smoking status: Former Games developermoker  . Smokeless tobacco: Never Used  . Tobacco comment: quit smoking 2009   Substance and Sexual Activity  . Alcohol use: Yes  . Drug use: No  . Sexual activity: Yes    Birth control/protection: Pill  Other Topics Concern  . Kaitlyn Dalton  Social History Narrative   Wears seat belt   Feels safe in relationship    3 kids 2 bio and 1 step son          Current Outpatient Medications:  .  aluminum chloride (DRYSOL) 20 % external solution, Try Drysol 1x at bedtime hands and feet. Once excess sweating stopped decrease to  1-2x weekly., Disp: 60 mL, Rfl: 11 .  buPROPion (WELLBUTRIN XL) 300 MG 24 hr tablet, TAKE 1 TABLET (300 MG TOTAL) BY MOUTH EVERY MORNING., Disp: 30 tablet, Rfl: 5 .  busPIRone (BUSPAR) 10 MG tablet, Take 1 tablet (10 mg total) by mouth 2 (two) times daily., Disp: 60 tablet, Rfl: 5 .  Cholecalciferol (VITAMIN D3) 2000 units TABS, Take by mouth., Disp: , Rfl:  .  levothyroxine (SYNTHROID, LEVOTHROID) 100 MCG tablet, Take 1 tablet (100 mcg total) by mouth daily before breakfast. Except 2 tabs on Wednesdays and Sundays, Disp: 40 tablet, Rfl: 5 .  phentermine (ADIPEX-P) 37.5 MG tablet, Take 1 tablet (37.5 mg total) by mouth daily before breakfast., Disp: 31 tablet, Rfl: 0 .  LORazepam (ATIVAN) 1 MG tablet, Take 1 tablet (1 mg total) by mouth 2 (two) times daily. (Patient Kaitlyn taking: Reported on 01/30/2018), Disp: 20 tablet, Rfl: 0 .  Norethindrone-Ethinyl  Estradiol-Fe Biphas (LO LOESTRIN FE) 1 MG-10 MCG / 10 MCG tablet, Take 1 tablet by mouth daily. SAMPLES FOR PT, Disp: 28 tablet, Rfl: 0   ROS:  Review of Systems  Constitutional: Negative for fever.  Gastrointestinal: Negative for blood in stool, constipation, diarrhea, nausea and vomiting.  Genitourinary: Negative for dyspareunia, dysuria, flank pain, frequency, hematuria, urgency, vaginal bleeding, vaginal discharge and vaginal pain.  Musculoskeletal: Negative for back pain.  Skin: Negative for rash.     OBJECTIVE:   Vitals:  BP 130/82   Pulse 91   Ht 5\' 9"  (1.753 m)   Wt 214 lb (97.1 kg)   LMP 01/24/2018 (Exact Date)   Breastfeeding? No   BMI 31.60 kg/m   Physical Exam  Constitutional: She is oriented to person, place, and time and well-developed, well-nourished, and in no distress.  Pulmonary/Chest: Effort normal.  Musculoskeletal: Normal range of motion.  Neurological: She is alert and oriented to person, place, and time.  Psychiatric: Memory, affect and judgment normal.  Vitals reviewed.   Assessment/Plan: Low libido - Check labs. Will f/u with results. Discussed OCPs role in decreased libido. Try Lo Loestrin. Also discussed IUD, [facility].  - Plan: Estradiol, Progesterone, Testosterone,Free and Total, Norethindrone-Ethinyl Estradiol-Fe Biphas (LO LOESTRIN FE) 1 MG-10 MCG / 10 MCG tablet  Screening cholesterol level - Plan: Lipid panel  Irregular menses - On OCPs in the past. Check labs. Change to Lo Loestrin to see if sx improve.  - Plan: Estradiol, Progesterone, Testosterone,Free and Total, Norethindrone-Ethinyl Estradiol-Fe Biphas (LO LOESTRIN FE) 1 MG-10 MCG / 10 MCG tablet    Return if symptoms worsen or fail to improve.  Kaitlyn Jentz B. Lillianah Swartzentruber, PA-C 01/30/2018 10:47 AM

## 2018-01-31 LAB — TESTOSTERONE,FREE AND TOTAL
TESTOSTERONE FREE: 0.7 pg/mL (ref 0.0–4.2)
TESTOSTERONE: 12 ng/dL (ref 8–48)

## 2018-01-31 LAB — ESTRADIOL: ESTRADIOL: 33.7 pg/mL

## 2018-01-31 LAB — PROGESTERONE

## 2018-01-31 LAB — LIPID PANEL
CHOLESTEROL TOTAL: 195 mg/dL (ref 100–199)
Chol/HDL Ratio: 3.5 ratio (ref 0.0–4.4)
HDL: 55 mg/dL (ref >39)
LDL Calculated: 125 mg/dL — ABNORMAL HIGH (ref 0–99)
Triglycerides: 76 mg/dL (ref 0–149)
VLDL Cholesterol Cal: 15 mg/dL (ref 5–40)

## 2018-02-02 ENCOUNTER — Encounter: Payer: Self-pay | Admitting: Internal Medicine

## 2018-02-02 ENCOUNTER — Ambulatory Visit (INDEPENDENT_AMBULATORY_CARE_PROVIDER_SITE_OTHER): Payer: No Typology Code available for payment source | Admitting: Internal Medicine

## 2018-02-02 VITALS — BP 120/70 | HR 104 | Temp 98.2°F | Ht 69.0 in | Wt 213.8 lb

## 2018-02-02 DIAGNOSIS — R61 Generalized hyperhidrosis: Secondary | ICD-10-CM | POA: Diagnosis not present

## 2018-02-02 DIAGNOSIS — E039 Hypothyroidism, unspecified: Secondary | ICD-10-CM

## 2018-02-02 DIAGNOSIS — F419 Anxiety disorder, unspecified: Secondary | ICD-10-CM

## 2018-02-02 DIAGNOSIS — F329 Major depressive disorder, single episode, unspecified: Secondary | ICD-10-CM | POA: Diagnosis not present

## 2018-02-02 DIAGNOSIS — E669 Obesity, unspecified: Secondary | ICD-10-CM

## 2018-02-02 DIAGNOSIS — F32A Depression, unspecified: Secondary | ICD-10-CM

## 2018-02-02 MED ORDER — PHENTERMINE HCL 37.5 MG PO TABS
37.5000 mg | ORAL_TABLET | Freq: Every day | ORAL | 0 refills | Status: DC
Start: 2018-02-02 — End: 2018-03-02

## 2018-02-02 NOTE — Patient Instructions (Signed)
F/u in 4 weeks  Exercising to Lose Weight Exercising can help you to lose weight. In order to lose weight through exercise, you need to do vigorous-intensity exercise. You can tell that you are exercising with vigorous intensity if you are breathing very hard and fast and cannot hold a conversation while exercising. Moderate-intensity exercise helps to maintain your current weight. You can tell that you are exercising at a moderate level if you have a higher heart rate and faster breathing, but you are still able to hold a conversation. How often should I exercise? Choose an activity that you enjoy and set realistic goals. Your health care provider can help you to make an activity plan that works for you. Exercise regularly as directed by your health care provider. This may include:  Doing resistance training twice each week, such as: ? Push-ups. ? Sit-ups. ? Lifting weights. ? Using resistance bands.  Doing a given intensity of exercise for a given amount of time. Choose from these options: ? 150 minutes of moderate-intensity exercise every week. ? 75 minutes of vigorous-intensity exercise every week. ? A mix of moderate-intensity and vigorous-intensity exercise every week.  Children, pregnant women, people who are out of shape, people who are overweight, and older adults may need to consult a health care provider for individual recommendations. If you have any sort of medical condition, be sure to consult your health care provider before starting a new exercise program. What are some activities that can help me to lose weight?  Walking at a rate of at least 4.5 miles an hour.  Jogging or running at a rate of 5 miles per hour.  Biking at a rate of at least 10 miles per hour.  Lap swimming.  Roller-skating or in-line skating.  Cross-country skiing.  Vigorous competitive sports, such as football, basketball, and soccer.  Jumping rope.  Aerobic dancing. How can I be more active  in my day-to-day activities?  Use the stairs instead of the elevator.  Take a walk during your lunch break.  If you drive, park your car farther away from work or school.  If you take public transportation, get off one stop early and walk the rest of the way.  Make all of your phone calls while standing up and walking around.  Get up, stretch, and walk around every 30 minutes throughout the day. What guidelines should I follow while exercising?  Do not exercise so much that you hurt yourself, feel dizzy, or get very short of breath.  Consult your health care provider prior to starting a new exercise program.  Wear comfortable clothes and shoes with good support.  Drink plenty of water while you exercise to prevent dehydration or heat stroke. Body water is lost during exercise and must be replaced.  Work out until you breathe faster and your heart beats faster. This information is not intended to replace advice given to you by your health care provider. Make sure you discuss any questions you have with your health care provider. Document Released: 12/17/2010 Document Revised: 04/21/2016 Document Reviewed: 04/17/2014 Elsevier Interactive Patient Education  [employer]2018 [employer].

## 2018-02-02 NOTE — Progress Notes (Signed)
Pre visit review using our clinic review tool, if applicable. No additional management support is needed unless otherwise documented below in the visit note. 

## 2018-02-02 NOTE — Progress Notes (Signed)
Chief Complaint  Patient presents with  . Follow-up   F/u  1. Weight loss down ~6+ lbs since last visit on adipex 1/3 Rx  2. Anxiety better taking buspar 10mg  qd prn  3. Reduced libido saw OB/GYN and changed OCP Tuesday.  4. Hypothyroidism appt with Dr. Lucianne Muss 02/23/18  5. Using drysol to feet and helping    Review of Systems  Constitutional: Positive for weight loss.  HENT: Negative for hearing loss.   Eyes: Negative for blurred vision.  Respiratory: Negative for shortness of breath.   Cardiovascular: Negative for chest pain.  Skin: Negative for rash.  Neurological: Negative for dizziness and headaches.  Psychiatric/Behavioral: The patient is nervous/anxious.    Past Medical History:  Diagnosis Date  . Anxiety   . Depression   . Hypothyroidism   . Obesity   . Thyroid disease    Past Surgical History:  Procedure Laterality Date  . CESAREAN SECTION  07/2008  . CESAREAN SECTION     Family History  Problem Relation Age of Onset  . Hyperlipidemia Mother   . Depression Mother   . Hypertension Mother   . Hypothyroidism Mother   . Thyroid disease Mother   . Hypertension Father   . Diabetes Father   . Asthma Father   . Heart disease Father   . Hyperlipidemia Father   . Lupus Sister   . Hypothyroidism Sister   . Asthma Sister   . Miscarriages / Stillbirths Sister   . Depression Son   . Heart disease Maternal Grandmother   . Dementia Maternal Grandmother   . Thyroid disease Maternal Grandmother   . Thyroid disease Maternal Grandfather   . Cancer Paternal Grandfather        skin colon  . Hashimoto's thyroiditis Sister   . Lupus Sister    Social History   Socioeconomic History  . Marital status: Married    Spouse name: Not on file  . Number of children: 1  . Years of education: Not on file  . Highest education level: Not on file  Social Needs  . Financial resource strain: Not on file  . Food insecurity - worry: Not on file  . Food insecurity - inability: Not  on file  . Transportation needs - medical: Not on file  . Transportation needs - non-medical: Not on file  Occupational History  . Occupation: Associate Professor at [employer]: [place]  Tobacco Use  . Smoking status: Former Games developer  . Smokeless tobacco: Never Used  . Tobacco comment: quit smoking 2009   Substance and Sexual Activity  . Alcohol use: Yes  . Drug use: No  . Sexual activity: Yes    Birth control/protection: Pill  Other Topics Concern  . Not on file  Social History Narrative   Wears seat belt   Feels safe in relationship    3 kids 2 bio and 1 step son        Current Meds  Medication Sig  . aluminum chloride (DRYSOL) 20 % external solution Try Drysol 1x at bedtime hands and feet. Once excess sweating stopped decrease to 1-2x weekly.  Marland Kitchen buPROPion (WELLBUTRIN XL) 300 MG 24 hr tablet TAKE 1 TABLET (300 MG TOTAL) BY MOUTH EVERY MORNING.  . busPIRone (BUSPAR) 10 MG tablet Take 1 tablet (10 mg total) by mouth 2 (two) times daily.  . Cholecalciferol (VITAMIN D3) 2000 units TABS Take by mouth.  . levothyroxine (SYNTHROID, LEVOTHROID) 100 MCG tablet Take 1 tablet (100  mcg total) by mouth daily before breakfast. Except 2 tabs on Wednesdays and Sundays  . LORazepam (ATIVAN) 1 MG tablet Take 1 tablet (1 mg total) by mouth 2 (two) times daily.  . Norethindrone-Ethinyl Estradiol-Fe Biphas (LO LOESTRIN FE) 1 MG-10 MCG / 10 MCG tablet Take 1 tablet by mouth daily. SAMPLES FOR PT  . phentermine (ADIPEX-P) 37.5 MG tablet Take 1 tablet (37.5 mg total) by mouth daily before breakfast.  . [DISCONTINUED] phentermine (ADIPEX-P) 37.5 MG tablet Take 1 tablet (37.5 mg total) by mouth daily before breakfast.   No Known Allergies Recent Results (from the past 2160 hour(s))  Comprehensive metabolic panel     Status: None   Collection Time: 01/05/18 11:44 AM  Result Value Ref Range   Sodium 136 135 - 145 mEq/L   Potassium 3.7 3.5 - 5.1 mEq/L   Chloride 102 96 - 112 mEq/L   CO2 29 19 - 32  mEq/L   Glucose, Bld 83 70 - 99 mg/dL   BUN 10 6 - 23 mg/dL   Creatinine, Ser 9.600.84 0.40 - 1.20 mg/dL   Total Bilirubin 0.4 0.2 - 1.2 mg/dL   Alkaline Phosphatase 85 39 - 117 U/L   AST 19 0 - 37 U/L   ALT 19 0 - 35 U/L   Total Protein 7.9 6.0 - 8.3 g/dL   Albumin 4.5 3.5 - 5.2 g/dL   Calcium 9.5 8.4 - 45.410.5 mg/dL   GFR 09.8184.12 >19.14>60.00 mL/min  Iron, TIBC and Ferritin Panel     Status: Abnormal   Collection Time: 01/05/18 11:44 AM  Result Value Ref Range   Iron 92 40 - 190 mcg/dL   TIBC 782471 (H) 956250 - 213450 mcg/dL (calc)   %SAT 20 11 - 50 % (calc)   Ferritin 8 (L) 10 - 154 ng/mL  Urinalysis, Routine w reflex microscopic     Status: Abnormal   Collection Time: 01/05/18 11:44 AM  Result Value Ref Range   Color, Urine YELLOW Yellow;Lt. Yellow   APPearance SL CLOUDY (A) Clear   Specific Gravity, Urine 1.010 1.000 - 1.030   pH 7.5 5.0 - 8.0   Total Protein, Urine NEGATIVE Negative   Urine Glucose NEGATIVE Negative   Ketones, ur NEGATIVE Negative   Bilirubin Urine NEGATIVE Negative   Hgb urine dipstick TRACE-LYSED (A) Negative   Urobilinogen, UA 0.2 0.0 - 1.0   Leukocytes, UA TRACE (A) Negative   Nitrite NEGATIVE Negative   WBC, UA 3-6/hpf (A) 0-2/hpf   RBC / HPF 0-2/hpf 0-2/hpf   Squamous Epithelial / LPF Few(5-10/hpf) (A) Rare(0-4/hpf)   Bacteria, UA Rare(<10/hpf) (A) None  Vitamin D (25 hydroxy)     Status: None   Collection Time: 01/05/18 11:44 AM  Result Value Ref Range   VITD 33.25 30.00 - 100.00 ng/mL  T4, free     Status: None   Collection Time: 01/05/18 11:44 AM  Result Value Ref Range   Free T4 0.92 0.60 - 1.60 ng/dL    Comment: Specimens from patients who are undergoing biotin therapy and /or ingesting biotin supplements may contain high levels of biotin.  The higher biotin concentration in these specimens interferes with this Free T4 assay.  Specimens that contain high levels  of biotin may cause false high results for this Free T4 assay.  Please interpret results in light  of the total clinical presentation of the patient.    TSH     Status: None   Collection Time: 01/05/18 11:44 AM  Result  Value Ref Range   TSH 2.03 0.35 - 4.50 uIU/mL  T3, free     Status: None   Collection Time: 01/05/18 11:44 AM  Result Value Ref Range   T3, Free 3.4 2.3 - 4.2 pg/mL  Thyroid peroxidase antibody     Status: Abnormal   Collection Time: 01/05/18 11:44 AM  Result Value Ref Range   Thyroperoxidase Ab SerPl-aCnc 113 (H) <9 IU/mL  Hepatitis B surface antibody     Status: None   Collection Time: 01/05/18 11:44 AM  Result Value Ref Range   Hepatitis B-Post 28 > OR = 10 mIU/mL    Comment: . Patient has immunity to hepatitis B virus. . For additional information, please refer to http://education.quest[facility].com/faq/FAQ105 (This link is being provided for informational/ educational purposes only).   CBC with Differential/Platelet     Status: Abnormal   Collection Time: 01/05/18 11:44 AM  Result Value Ref Range   WBC 5.3 3.8 - 10.8 Thousand/uL   RBC 4.80 3.80 - 5.10 Million/uL   Hemoglobin 12.8 11.7 - 15.5 g/dL   HCT 84.6 96.2 - 95.2 %   MCV 82.5 80.0 - 100.0 fL   MCH 26.7 (L) 27.0 - 33.0 pg   MCHC 32.3 32.0 - 36.0 g/dL   RDW 84.1 32.4 - 40.1 %   Platelets 327 140 - 400 Thousand/uL   MPV 10.7 7.5 - 12.5 fL   Neutro Abs 2,173 1,500 - 7,800 cells/uL   Lymphs Abs 2,549 850 - 3,900 cells/uL   WBC mixed population 477 200 - 950 cells/uL   Eosinophils Absolute 42 15 - 500 cells/uL   Basophils Absolute 58 0 - 200 cells/uL   Neutrophils Relative % 41 %   Total Lymphocyte 48.1 %   Monocytes Relative 9.0 %   Eosinophils Relative 0.8 %   Basophils Relative 1.1 %  Estradiol     Status: None   Collection Time: 01/30/18  4:05 PM  Result Value Ref Range   Estradiol 33.7 pg/mL    Comment:                     Adult Female:                       Follicular phase   12.5 -   166.0                       Ovulation phase    85.8 -   498.0                       Luteal phase        43.8 -   211.0                       Postmenopausal     <6.0 -    54.7                     Pregnancy                       1st trimester     215.0 - >4300.0                     Girls (1-10 years)    6.0 -    27.0 Roche ECLIA methodology   Progesterone     Status: None   Collection Time:  01/30/18  4:05 PM  Result Value Ref Range   Progesterone <0.1 ng/mL    Comment:                      Follicular phase       0.1 -   0.9                      Luteal phase           1.8 -  23.9                      Ovulation phase        0.1 -  12.0                      Pregnant                         First trimester    11.0 -  44.3                         Second trimester   25.4 -  83.3                         Third trimester    58.7 - 214.0                      Postmenopausal         0.0 -   0.1   Testosterone,Free and Total     Status: None   Collection Time: 01/30/18  4:05 PM  Result Value Ref Range   Testosterone 12 8 - 48 ng/dL   Testosterone, Free 0.7 0.0 - 4.2 pg/mL  Lipid panel     Status: Abnormal   Collection Time: 01/30/18  4:05 PM  Result Value Ref Range   Cholesterol, Total 195 100 - 199 mg/dL   Triglycerides 76 0 - 149 mg/dL   HDL 55 >40 mg/dL   VLDL Cholesterol Cal 15 5 - 40 mg/dL   LDL Calculated 981 (H) 0 - 99 mg/dL   Chol/HDL Ratio 3.5 0.0 - 4.4 ratio    Comment:                                   T. Chol/HDL Ratio                                             Men  Women                               1/2 Avg.Risk  3.4    3.3                                   Avg.Risk  5.0    4.4                                2X Avg.Risk  9.6    7.1  3X Avg.Risk 23.4   11.0    Objective  Body mass index is 31.57 kg/m. Wt Readings from Last 3 Encounters:  02/02/18 213 lb 12.8 oz (97 kg)  01/30/18 214 lb (97.1 kg)  01/05/18 220 lb 6.4 oz (100 kg)   Temp Readings from Last 3 Encounters:  02/02/18 98.2 F (36.8 C) (Oral)  01/05/18 98.7 F (37.1 C)  (Oral)  05/07/16 97.6 F (36.4 C) (Oral)   BP Readings from Last 3 Encounters:  02/02/18 120/70  01/30/18 130/82  01/05/18 118/86   Pulse Readings from Last 3 Encounters:  02/02/18 (!) 104  01/30/18 91  01/05/18 87   O2 sat room air 97%  Physical Exam  Constitutional: She is oriented to person, place, and time and well-developed, well-nourished, and in no distress. Vital signs are normal.  HENT:  Head: Normocephalic and atraumatic.  Mouth/Throat: Oropharynx is clear and moist.  Eyes: Conjunctivae are normal. Pupils are equal, round, and reactive to light.  Cardiovascular: Regular rhythm and normal heart sounds. Tachycardia present.  Pulmonary/Chest: Effort normal and breath sounds normal.  Neurological: She is alert and oriented to person, place, and time. Gait normal. Gait normal.  Skin: Skin is warm, dry and intact.  Psychiatric: Mood, memory, affect and judgment normal.  Nursing note and vitals reviewed.   Assessment   1. Obesity  2. Anxiety/depression  3. Hypothyroidism 4. Reduced libido 5. Hyperhidrosis  6. HM Plan  1.  adipex rx 2/3 fu in 1 month 2.  Cont wellbutrin, prn buspar 10 mg qd 3.  F/u endocrine 02/23/18  4. Changed OCP with OB/GYN recently  5.  Cont drysol prn, prn zeasorb AF  6.  Had flu shot 09/03/17  Tdap in 2017  Had HPV vaccine  Pap [facility] 08/2015 get records.   Skin appt 02/2018  Provider: Dr. French Ana McLean-Scocuzza-Internal Medicine

## 2018-02-19 ENCOUNTER — Telehealth: Payer: Self-pay | Admitting: Internal Medicine

## 2018-02-19 NOTE — Telephone Encounter (Signed)
Copied from CRM [phone]. Topic: Quick Communication - Rx Refill/Question >> Feb 19, 2018  5:12 PM Landry MellowFoltz, [name] wrote: Medication: buPROPion (WELLBUTRIN XL) 300 MG 24 hr tablet Has the patient contacted their pharmacy? Yes.   (Agent: If no, request that the patient contact the pharmacy for the refill.) Preferred Pharmacy (with phone number or street name): [place] [facility]  Agent: Please be advised that RX refills may take up to 3 business days. We ask that you follow-up with your pharmacy. Med is on long term back order - need a different medication called in

## 2018-02-20 NOTE — Telephone Encounter (Signed)
Pt states Wellbutrin XL is on long term back order and is requesting that another medication be sent in to [facility].

## 2018-02-21 NOTE — Telephone Encounter (Signed)
Please advise 

## 2018-02-21 NOTE — Telephone Encounter (Signed)
Does she want to try a different formulation of Wellbutrin or try to see if another pharmacy may have it 1st?  TMS

## 2018-02-22 ENCOUNTER — Telehealth: Payer: Self-pay | Admitting: Internal Medicine

## 2018-02-22 NOTE — Telephone Encounter (Signed)
Left message for patient to return call back to be asked and informed of below. Pec may give information

## 2018-02-22 NOTE — Telephone Encounter (Signed)
Copied from CRM [id number]. Topic: Quick Communication - See Telephone Encounter >> Feb 22, 2018 11:33 AM Eston Mouldavis, Searra Carnathan B wrote: CRM for notification. See Telephone encounter for: 02/22/18. Marisue IvanLiz from   [facility] called for a rx for another medication for the pt as busPIRone (BUSPAR) 10 MG tablet is still on back order.   [facility] [place] [address] [phone] (Phone) [phone] (Fax)

## 2018-02-23 ENCOUNTER — Ambulatory Visit (INDEPENDENT_AMBULATORY_CARE_PROVIDER_SITE_OTHER): Payer: No Typology Code available for payment source | Admitting: Endocrinology

## 2018-02-23 ENCOUNTER — Encounter: Payer: Self-pay | Admitting: Endocrinology

## 2018-02-23 VITALS — BP 122/82 | HR 96 | Ht 69.0 in | Wt 211.0 lb

## 2018-02-23 DIAGNOSIS — E063 Autoimmune thyroiditis: Secondary | ICD-10-CM

## 2018-02-23 LAB — TSH: TSH: 0.3 u[IU]/mL — ABNORMAL LOW (ref 0.35–4.50)

## 2018-02-23 NOTE — Progress Notes (Signed)
Patient ID: Kaitlyn Dalton, female   DOB: June 24, 1986, 32 y.o.   MRN: [id number]           Referring Physician: Desma Maxim  Reason for Appointment:  Hypothyroidism, new visit    History of Present Illness:   Hypothyroidism was first diagnosed  around the year 2009  At the time of diagnosis patient was having symptoms of  fatigue and weight gain but no others typical symptoms of cold sensitivity, difficulty concentrating, dry skin hair and loss that she can recall.    Her symptoms started a few months after the birth of her first child        The patient has been treated with  both Armour Thyroid and levothyroxine  Initially the patient was on Armour Thyroid for several years with fairly good relief of her symptoms of fatigue However a few years ago another physician changed her to levothyroxine She think that she had a little bit energy level with using levothyroxine Previously had been seen by an endocrinologist  She has been followed by her PCP for the last couple of years with a prescription for levothyroxine 100 mcg Patient thinks that she feels better when her TSH levels are in the lower end of the range When she was taking 10 tablets a week of the levothyroxine her TSH was low at 0.31 previously in 10/18 and the dose was reduced to 9 tablets a week She takes 1 tablet daily and an extra 100 mcg twice a week She does take her medication before eating breakfast in the morning daily and is fairly consistent with this regimen  She is still having some symptoms of fatigue and she thinks she feels a little better on the day she takes the extra 100 mcg dose. However she is working and also taking care of 2 children at home  Also has had consistent difficulty losing weight Does not complain of any cold intolerance, dry skin, hoarseness or difficulty concentration   She has been referred here because of the finding of abnormal thyroid peroxidase antibody of 113, normal <9 She  thinks an antibody test previously was normal         Patient's weight history is as follows:  Wt Readings from Last 3 Encounters:  02/23/18 211 lb (95.7 kg)  02/02/18 213 lb 12.8 oz (97 kg)  01/30/18 214 lb (97.1 kg)    Thyroid function results have been as follows:  Lab Results  Component Value Date   TSH 2.03 01/05/2018   TSH 1.330 10/17/2017   TSH 0.315 (L) 09/14/2017   TSH 2.860 06/12/2017   FREET4 0.92 01/05/2018   FREET4 1.31 10/17/2017   FREET4 1.71 09/14/2017   FREET4 1.51 06/12/2017   T3FREE 3.4 01/05/2018   T3FREE 3.1 10/06/2009     Past Medical History:  Diagnosis Date  . Anxiety   . Depression   . Hypothyroidism   . Obesity   . Thyroid disease     Past Surgical History:  Procedure Laterality Date  . CESAREAN SECTION  07/2008  . CESAREAN SECTION      Family History  Problem Relation Age of Onset  . Hyperlipidemia Mother   . Depression Mother   . Hypertension Mother   . Hypothyroidism Mother   . Thyroid disease Mother   . Hypertension Father   . Diabetes Father   . Asthma Father   . Heart disease Father   . Hyperlipidemia Father   . Lupus Sister   .  Hypothyroidism Sister   . Asthma Sister   . Miscarriages / Stillbirths Sister   . Depression Son   . Heart disease Maternal Grandmother   . Dementia Maternal Grandmother   . Thyroid disease Maternal Grandmother   . Thyroid disease Maternal Grandfather   . Cancer Paternal Grandfather        skin colon  . Hashimoto's thyroiditis Sister   . Lupus Sister     Social History:  reports that she has quit smoking. She has never used smokeless tobacco. She reports that she drinks alcohol. She reports that she does not use drugs.  Allergies: No Known Allergies  Allergies as of 02/23/2018   No Known Allergies     Medication List        Accurate as of 02/23/18  4:06 PM. Always use your most recent med list.          aluminum chloride 20 % external solution Commonly known as:  DRYSOL Try  Drysol 1x at bedtime hands and feet. Once excess sweating stopped decrease to 1-2x weekly.   buPROPion 300 MG 24 hr tablet Commonly known as:  WELLBUTRIN XL TAKE 1 TABLET (300 MG TOTAL) BY MOUTH EVERY MORNING.   busPIRone 10 MG tablet Commonly known as:  BUSPAR Take 1 tablet (10 mg total) by mouth 2 (two) times daily.   levothyroxine 100 MCG tablet Commonly known as:  SYNTHROID, LEVOTHROID Take 1 tablet (100 mcg total) by mouth daily before breakfast. Except 2 tabs on Wednesdays and Sundays   LORazepam 1 MG tablet Commonly known as:  ATIVAN Take 1 tablet (1 mg total) by mouth 2 (two) times daily.   Norethindrone-Ethinyl Estradiol-Fe Biphas 1 MG-10 MCG / 10 MCG tablet Commonly known as:  LO LOESTRIN FE Take 1 tablet by mouth daily. SAMPLES FOR PT   phentermine 37.5 MG tablet Commonly known as:  ADIPEX-P Take 1 tablet (37.5 mg total) by mouth daily before breakfast.   Vitamin D3 2000 units Tabs Take by mouth.           Review of Systems  Constitutional:       Has difficulty losing weight and is now being given phentermine for 3 months by her PCP  HENT: Negative for hoarseness and trouble swallowing.   Respiratory: Negative for daytime sleepiness and shortness of breath.   Cardiovascular: Negative for palpitations.  Gastrointestinal: Negative for constipation.  Endocrine: Negative for cold intolerance.       Menstrual cycles are regulated by birth control pills, recently her prescription has been changed  Musculoskeletal: Negative for joint pain and back pain.       She thinks her hands tend to swell at times  Skin:       She thinks the skin on her cheeks may look a little reddish at the time without any rash, not consistent  Neurological: Negative for weakness and numbness.  Psychiatric/Behavioral:       History of anxiety controlled with medications                Examination:    BP 122/82 (BP Location: Right Arm, Patient Position: Sitting, Cuff Size: Normal)    Pulse 96   Ht 5\' 9"  (1.753 m)   Wt 211 lb (95.7 kg)   LMP 01/24/2018 (Exact Date)   SpO2 98%   BMI 31.16 kg/m   GENERAL:  Average build.  She looks well No puffiness of the face or eyes  No pallor, clubbing, lymphadenopathy or edema.  Skin:  no rash or pigmentation.  EYES:  No prominence of the eyes or swelling of the eyelids  ENT: Oral mucosa and tongue normal.  THYROID:  Not palpable.  HEART:  Normal  S1 and S2; no murmur or click.  CHEST:    Lungs: Vescicular breath sounds heard equally.  No crepitations/ wheeze.  ABDOMEN:  No distention.  Liver and spleen not palpable.  No other mass or tenderness.  NEUROLOGICAL: Reflexes are bilaterally normal at biceps.  JOINTS:  Normal peripheral joints.   Assessment:  HYPOTHYROIDISM, long-standing and not associated with a goiter  With her onset of the condition within a few months after pregnancy and her strong family history of hypothyroidism she does have autoimmune thyroid disease She does have some nonspecific fatigue  Currently on a regimen of levothyroxine 100 mcg but is taking 2 tablets together on 2 days of the week She reports a little better energy level on the day she takes a higher dose of levothyroxine This corresponds to an average daily dose of about 128 mcg  Positive thyroid antibodies: This is an expected finding given her diagnosis and does not change her management in any way  Malar rash: This is not evident on exam today and she thinks this is mild and inconsistent; however she should be watched for any other symptoms suggestive of lupus because of her family history  PLAN:    She will be maintained on levothyroxine as t the only his therapy, discussed that she will not benefit from combination of T4 and T3 hormone regimen since her T3 level is adequate  Check thyroid levels today and decide on her levothyroxine dosing Discussed that she will be switched to a consistent daily dose rather than double  the dose on 2 days a week to give her more consistent levels   Follow-up to be decided after her labs are available, if her dosages change she will be seen back in 6-8 weeks   Reather Littler 02/23/2018, 4:06 PM   Consultation note copy sent to the PCP  ADDENDUM: Her TSH is low at 0.3; will switch her to levothyroxine 112 mcg daily with extra half tablet   Note: This office note was prepared with Dragon voice recognition system technology. Any transcriptional errors that result from this process are unintentional.

## 2018-02-23 NOTE — Telephone Encounter (Signed)
Spoken to patient. She stated it was for Buspar not wellbutrin.  She is going to try and transfer medication to another pharmacy.  She stated that of she can not she will send mychart on Monday.

## 2018-02-25 ENCOUNTER — Encounter: Payer: Self-pay | Admitting: Endocrinology

## 2018-02-25 ENCOUNTER — Encounter (INDEPENDENT_AMBULATORY_CARE_PROVIDER_SITE_OTHER): Payer: Self-pay

## 2018-02-25 MED ORDER — LEVOTHYROXINE SODIUM 112 MCG PO TABS: ORAL_TABLET | ORAL | 1 refills | Status: DC

## 2018-02-26 ENCOUNTER — Encounter: Payer: Self-pay | Admitting: Internal Medicine

## 2018-02-26 ENCOUNTER — Other Ambulatory Visit: Payer: Self-pay | Admitting: Internal Medicine

## 2018-02-26 DIAGNOSIS — E039 Hypothyroidism, unspecified: Secondary | ICD-10-CM

## 2018-03-02 ENCOUNTER — Ambulatory Visit (INDEPENDENT_AMBULATORY_CARE_PROVIDER_SITE_OTHER): Payer: No Typology Code available for payment source | Admitting: Internal Medicine

## 2018-03-02 ENCOUNTER — Encounter: Payer: Self-pay | Admitting: Internal Medicine

## 2018-03-02 VITALS — BP 112/66 | HR 89 | Temp 98.3°F | Ht 69.0 in | Wt 211.6 lb

## 2018-03-02 DIAGNOSIS — E669 Obesity, unspecified: Secondary | ICD-10-CM

## 2018-03-02 DIAGNOSIS — F329 Major depressive disorder, single episode, unspecified: Secondary | ICD-10-CM

## 2018-03-02 DIAGNOSIS — F419 Anxiety disorder, unspecified: Secondary | ICD-10-CM

## 2018-03-02 DIAGNOSIS — E039 Hypothyroidism, unspecified: Secondary | ICD-10-CM | POA: Diagnosis not present

## 2018-03-02 DIAGNOSIS — F32A Depression, unspecified: Secondary | ICD-10-CM

## 2018-03-02 MED ORDER — PHENTERMINE HCL 37.5 MG PO TABS: 37.5000 mg | ORAL_TABLET | Freq: Every day | ORAL | 0 refills | Status: DC

## 2018-03-02 NOTE — Progress Notes (Signed)
Pre visit review using our clinic review tool, if applicable. No additional management support is needed unless otherwise documented below in the visit note. 

## 2018-03-02 NOTE — Patient Instructions (Signed)
Try Ferrous gluconate if you cant tolerate slow release iron   Exercising to Lose Weight Exercising can help you to lose weight. In order to lose weight through exercise, you need to do vigorous-intensity exercise. You can tell that you are exercising with vigorous intensity if you are breathing very hard and fast and cannot hold a conversation while exercising. Moderate-intensity exercise helps to maintain your current weight. You can tell that you are exercising at a moderate level if you have a higher heart rate and faster breathing, but you are still able to hold a conversation. How often should I exercise? Choose an activity that you enjoy and set realistic goals. Your health care provider can help you to make an activity plan that works for you. Exercise regularly as directed by your health care provider. This may include:  Doing resistance training twice each week, such as: ? Push-ups. ? Sit-ups. ? Lifting weights. ? Using resistance bands.  Doing a given intensity of exercise for a given amount of time. Choose from these options: ? 150 minutes of moderate-intensity exercise every week. ? 75 minutes of vigorous-intensity exercise every week. ? A mix of moderate-intensity and vigorous-intensity exercise every week.  Children, pregnant women, people who are out of shape, people who are overweight, and older adults may need to consult a health care provider for individual recommendations. If you have any sort of medical condition, be sure to consult your health care provider before starting a new exercise program. What are some activities that can help me to lose weight?  Walking at a rate of at least 4.5 miles an hour.  Jogging or running at a rate of 5 miles per hour.  Biking at a rate of at least 10 miles per hour.  Lap swimming.  Roller-skating or in-line skating.  Cross-country skiing.  Vigorous competitive sports, such as football, basketball, and soccer.  Jumping  rope.  Aerobic dancing. How can I be more active in my day-to-day activities?  Use the stairs instead of the elevator.  Take a walk during your lunch break.  If you drive, park your car farther away from work or school.  If you take public transportation, get off one stop early and walk the rest of the way.  Make all of your phone calls while standing up and walking around.  Get up, stretch, and walk around every 30 minutes throughout the day. What guidelines should I follow while exercising?  Do not exercise so much that you hurt yourself, feel dizzy, or get very short of breath.  Consult your health care provider prior to starting a new exercise program.  Wear comfortable clothes and shoes with good support.  Drink plenty of water while you exercise to prevent dehydration or heat stroke. Body water is lost during exercise and must be replaced.  Work out until you breathe faster and your heart beats faster. This information is not intended to replace advice given to you by your health care provider. Make sure you discuss any questions you have with your health care provider. Document Released: 12/17/2010 Document Revised: 04/21/2016 Document Reviewed: 04/17/2014 Elsevier Interactive Patient Education  [employer]2018 Elsevier Inc.

## 2018-03-05 ENCOUNTER — Encounter: Payer: Self-pay | Admitting: Internal Medicine

## 2018-03-05 NOTE — Progress Notes (Signed)
Chief Complaint  Patient presents with  . Follow-up   1 month f/u  1. Hypothyroidism saw Dr. Lucianne Muss who changed dose to levothyroxine 112 mcg qd from prior dose tsh due to be repeated in 6 weeks she prefers to f/u with PCP thyroid disease  2. Weight is down with Adipex her scales at home reading 209  3. Mood improved PHQ 9 score 5 today. She has not been able to tell really if lo loestrin has improved sex drive but may have helped a little   Review of Systems  Constitutional: Positive for weight loss.  HENT: Negative for hearing loss.   Eyes: Negative for blurred vision.  Respiratory: Negative for shortness of breath.   Cardiovascular: Negative for chest pain.  Gastrointestinal: Negative for abdominal pain.  Skin: Negative for rash.  Neurological: Negative for headaches.  Psychiatric/Behavioral: Negative for depression.       Mood improve d   Past Medical History:  Diagnosis Date  . Anxiety   . Depression   . Hypothyroidism   . Obesity   . Thyroid disease    Past Surgical History:  Procedure Laterality Date  . CESAREAN SECTION  07/2008  . CESAREAN SECTION     Family History  Problem Relation Age of Onset  . Hyperlipidemia Mother   . Depression Mother   . Hypertension Mother   . Hypothyroidism Mother   . Thyroid disease Mother   . Hypertension Father   . Diabetes Father   . Asthma Father   . Heart disease Father   . Hyperlipidemia Father   . Lupus Sister   . Hypothyroidism Sister   . Asthma Sister   . Miscarriages / Stillbirths Sister   . Depression Son   . Heart disease Maternal Grandmother   . Dementia Maternal Grandmother   . Thyroid disease Maternal Grandmother   . Thyroid disease Maternal Grandfather   . Cancer Paternal Grandfather        skin colon  . Hashimoto's thyroiditis Sister        Thyroid nodules present, treated surgically  . Lupus Sister    Social History   Socioeconomic History  . Marital status: Married    Spouse name: Not on file  .  Number of children: 1  . Years of education: Not on file  . Highest education level: Not on file  Occupational History  . 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 Financial resource strain: Not on file  . Food insecurity:    Worry: Not on file    Inability: Not on file  . Transportation needs:    Medical: Not on file    Non-medical: Not on file  Tobacco Use  . Smoking status: Former Games developer  . Smokeless tobacco: Never Used  . Tobacco comment: quit smoking 2009   Substance and Sexual Activity  . Alcohol use: Yes  . Drug use: No  . Sexual activity: Yes    Birth control/protection: Pill  Lifestyle  . Physical activity:    Days per week: 3 days    Minutes per session: 30 min  . Stress: To some extent  Relationships  . Social connections:    Talks on phone: More than three times a week    Gets together: Three times a week    Attends religious service: More than 4 times per year    Active member of club or organization: No    Attends meetings of clubs  or organizations: Never    Relationship status: Married  . Intimate partner violence:    Fear of current or ex partner: No    Emotionally abused: No    Physically abused: No    Forced sexual activity: No  Other Topics Concern  . Not on file  Social History Narrative   Wears seat belt   Feels safe in relationship    3 kids 2 bio and 1 step son        Current Meds  Medication Sig  . aluminum chloride (DRYSOL) 20 % external solution Try Drysol 1x at bedtime hands and feet. Once excess sweating stopped decrease to 1-2x weekly.  Marland Kitchen buPROPion (WELLBUTRIN XL) 300 MG 24 hr tablet TAKE 1 TABLET (300 MG TOTAL) BY MOUTH EVERY MORNING.  . busPIRone (BUSPAR) 10 MG tablet Take 1 tablet (10 mg total) by mouth 2 (two) times daily.  . Cholecalciferol (VITAMIN D3) 2000 units TABS Take by mouth.  . levothyroxine (SYNTHROID, LEVOTHROID) 112 MCG tablet 1 tablet daily in 1-1/2 tablets on Wednesdays  .  LORazepam (ATIVAN) 1 MG tablet Take 1 tablet (1 mg total) by mouth 2 (two) times daily.  . Norethindrone-Ethinyl Estradiol-Fe Biphas (LO LOESTRIN FE) 1 MG-10 MCG / 10 MCG tablet Take 1 tablet by mouth daily. SAMPLES FOR PT  . phentermine (ADIPEX-P) 37.5 MG tablet Take 1 tablet (37.5 mg total) by mouth daily before breakfast.  . [DISCONTINUED] phentermine (ADIPEX-P) 37.5 MG tablet Take 1 tablet (37.5 mg total) by mouth daily before breakfast.   No Known Allergies Recent Results (from the past 2160 hour(s))  Comprehensive metabolic panel     Status: None   Collection Time: 01/05/18 11:44 AM  Result Value Ref Range   Sodium 136 135 - 145 mEq/L   Potassium 3.7 3.5 - 5.1 mEq/L   Chloride 102 96 - 112 mEq/L   CO2 29 19 - 32 mEq/L   Glucose, Bld 83 70 - 99 mg/dL   BUN 10 6 - 23 mg/dL   Creatinine, Ser 1.61 0.40 - 1.20 mg/dL   Total Bilirubin 0.4 0.2 - 1.2 mg/dL   Alkaline Phosphatase 85 39 - 117 U/L   AST 19 0 - 37 U/L   ALT 19 0 - 35 U/L   Total Protein 7.9 6.0 - 8.3 g/dL   Albumin 4.5 3.5 - 5.2 g/dL   Calcium 9.5 8.4 - 09.6 mg/dL   GFR 04.54 >09.81 mL/min  Iron, TIBC and Ferritin Panel     Status: Abnormal   Collection Time: 01/05/18 11:44 AM  Result Value Ref Range   Iron 92 40 - 190 mcg/dL   TIBC 191 (H) 478 - 295 mcg/dL (calc)   %SAT 20 11 - 50 % (calc)   Ferritin 8 (L) 10 - 154 ng/mL  Urinalysis, Routine w reflex microscopic     Status: Abnormal   Collection Time: 01/05/18 11:44 AM  Result Value Ref Range   Color, Urine YELLOW Yellow;Lt. Yellow   APPearance SL CLOUDY (A) Clear   Specific Gravity, Urine 1.010 1.000 - 1.030   pH 7.5 5.0 - 8.0   Total Protein, Urine NEGATIVE Negative   Urine Glucose NEGATIVE Negative   Ketones, ur NEGATIVE Negative   Bilirubin Urine NEGATIVE Negative   Hgb urine dipstick TRACE-LYSED (A) Negative   Urobilinogen, UA 0.2 0.0 - 1.0   Leukocytes, UA TRACE (A) Negative   Nitrite NEGATIVE Negative   WBC, UA 3-6/hpf (A) 0-2/hpf   RBC / HPF 0-2/hpf  0-2/hpf   Squamous Epithelial / LPF Few(5-10/hpf) (A) Rare(0-4/hpf)   Bacteria, UA Rare(<10/hpf) (A) None  Vitamin D (25 hydroxy)     Status: None   Collection Time: 01/05/18 11:44 AM  Result Value Ref Range   VITD 33.25 30.00 - 100.00 ng/mL  T4, free     Status: None   Collection Time: 01/05/18 11:44 AM  Result Value Ref Range   Free T4 0.92 0.60 - 1.60 ng/dL    Comment: Specimens from patients who are undergoing biotin therapy and /or ingesting biotin supplements may contain high levels of biotin.  The higher biotin concentration in these specimens interferes with this Free T4 assay.  Specimens that contain high levels  of biotin may cause false high results for this Free T4 assay.  Please interpret results in light of the total clinical presentation of the patient.    TSH     Status: None   Collection Time: 01/05/18 11:44 AM  Result Value Ref Range   TSH 2.03 0.35 - 4.50 uIU/mL  T3, free     Status: None   Collection Time: 01/05/18 11:44 AM  Result Value Ref Range   T3, Free 3.4 2.3 - 4.2 pg/mL  Thyroid peroxidase antibody     Status: Abnormal   Collection Time: 01/05/18 11:44 AM  Result Value Ref Range   Thyroperoxidase Ab SerPl-aCnc 113 (H) <9 IU/mL  Hepatitis B surface antibody     Status: None   Collection Time: 01/05/18 11:44 AM  Result Value Ref Range   Hepatitis B-Post 28 > OR = 10 mIU/mL    Comment: . Patient has immunity to hepatitis B virus. . For additional information, please refer to http://education.quest[facility].com/faq/FAQ105 (This link is being provided for informational/ educational purposes only).   CBC with Differential/Platelet     Status: Abnormal   Collection Time: 01/05/18 11:44 AM  Result Value Ref Range   WBC 5.3 3.8 - 10.8 Thousand/uL   RBC 4.80 3.80 - 5.10 Million/uL   Hemoglobin 12.8 11.7 - 15.5 g/dL   HCT 16.1 09.6 - 04.5 %   MCV 82.5 80.0 - 100.0 fL   MCH 26.7 (L) 27.0 - 33.0 pg   MCHC 32.3 32.0 - 36.0 g/dL   RDW 40.9 81.1 - 91.4 %    Platelets 327 140 - 400 Thousand/uL   MPV 10.7 7.5 - 12.5 fL   Neutro Abs 2,173 1,500 - 7,800 cells/uL   Lymphs Abs 2,549 850 - 3,900 cells/uL   WBC mixed population 477 200 - 950 cells/uL   Eosinophils Absolute 42 15 - 500 cells/uL   Basophils Absolute 58 0 - 200 cells/uL   Neutrophils Relative % 41 %   Total Lymphocyte 48.1 %   Monocytes Relative 9.0 %   Eosinophils Relative 0.8 %   Basophils Relative 1.1 %  Estradiol     Status: None   Collection Time: 01/30/18  4:05 PM  Result Value Ref Range   Estradiol 33.7 pg/mL    Comment:                     Adult Female:                       Follicular phase   12.5 -   166.0                       Ovulation phase    85.8 -   498.0  Luteal phase       43.8 -   211.0                       Postmenopausal     <6.0 -    54.7                     Pregnancy                       1st trimester     215.0 - >4300.0                     Girls (1-10 years)    6.0 -    27.0 Roche ECLIA methodology   Progesterone     Status: None   Collection Time: 01/30/18  4:05 PM  Result Value Ref Range   Progesterone <0.1 ng/mL    Comment:                      Follicular phase       0.1 -   0.9                      Luteal phase           1.8 -  23.9                      Ovulation phase        0.1 -  12.0                      Pregnant                         First trimester    11.0 -  44.3                         Second trimester   25.4 -  83.3                         Third trimester    58.7 - 214.0                      Postmenopausal         0.0 -   0.1   Testosterone,Free and Total     Status: None   Collection Time: 01/30/18  4:05 PM  Result Value Ref Range   Testosterone 12 8 - 48 ng/dL   Testosterone, Free 0.7 0.0 - 4.2 pg/mL  Lipid panel     Status: Abnormal   Collection Time: 01/30/18  4:05 PM  Result Value Ref Range   Cholesterol, Total 195 100 - 199 mg/dL   Triglycerides 76 0 - 149 mg/dL   HDL 55 >81 mg/dL   VLDL  Cholesterol Cal 15 5 - 40 mg/dL   LDL Calculated 191 (H) 0 - 99 mg/dL   Chol/HDL Ratio 3.5 0.0 - 4.4 ratio    Comment:                                   T. Chol/HDL Ratio  Men  Women                               1/2 Avg.Risk  3.4    3.3                                   Avg.Risk  5.0    4.4                                2X Avg.Risk  9.6    7.1                                3X Avg.Risk 23.4   11.0   TSH     Status: Abnormal   Collection Time: 02/23/18  4:31 PM  Result Value Ref Range   TSH 0.30 (L) 0.35 - 4.50 uIU/mL   Objective  Body mass index is 31.25 kg/m. Wt Readings from Last 3 Encounters:  03/02/18 211 lb 9.6 oz (96 kg)  02/23/18 211 lb (95.7 kg)  02/02/18 213 lb 12.8 oz (97 kg)   Temp Readings from Last 3 Encounters:  03/02/18 98.3 F (36.8 C) (Oral)  02/02/18 98.2 F (36.8 C) (Oral)  01/05/18 98.7 F (37.1 C) (Oral)   BP Readings from Last 3 Encounters:  03/02/18 112/66  02/23/18 122/82  02/02/18 120/70   Pulse Readings from Last 3 Encounters:  03/02/18 89  02/23/18 96  02/02/18 (!) 104    Physical Exam  Constitutional: She is oriented to person, place, and time. Vital signs are normal. She appears well-developed and well-nourished.  HENT:  Head: Normocephalic and atraumatic.  Mouth/Throat: Oropharynx is clear and moist and mucous membranes are normal.  Eyes: Pupils are equal, round, and reactive to light.  Cardiovascular: Normal rate, regular rhythm and normal heart sounds.  Pulmonary/Chest: Effort normal and breath sounds normal.  Neurological: She is alert and oriented to person, place, and time. Gait normal.  Skin: Skin is warm, dry and intact.  Psychiatric: She has a normal mood and affect. Her speech is normal and behavior is normal. Judgment and thought content normal. Cognition and memory are normal.  Nursing note and vitals reviewed.   Assessment   1. Hypothyroidism  2. Obesity BMI 31.25  3.  Depression improved PHQ 9 score today 4, anxiety 4. HM Plan  1. Cont levo 112 qd check TSH 04/06/18 given [facility] form to have done at her job  2. 3/3 Rx Adipex f/u in 4months continue healthy diet choices and exercise  3.  Cont meds buspar taking 10 mg qd to bid, wellbutrin 300 mg xl  4.  Had flu shot 09/03/17  Tdap in 2017  Had HPV vaccine  Pap [facility] 08/2015 get records.  Skin appt 4//19/19 on [address]    Disc ferrous gluconate vs slow iron release h/o anemia cant tolerate ferrous sulfate  Provider: Dr. French Anaracy McLean-Scocuzza-Internal Medicine

## 2018-04-12 ENCOUNTER — Encounter: Payer: Self-pay | Admitting: Obstetrics and Gynecology

## 2018-04-12 ENCOUNTER — Ambulatory Visit (INDEPENDENT_AMBULATORY_CARE_PROVIDER_SITE_OTHER): Payer: No Typology Code available for payment source | Admitting: Obstetrics and Gynecology

## 2018-04-12 VITALS — BP 108/76 | HR 97 | Ht 69.0 in | Wt 209.0 lb

## 2018-04-12 DIAGNOSIS — Z1151 Encounter for screening for human papillomavirus (HPV): Secondary | ICD-10-CM

## 2018-04-12 DIAGNOSIS — Z124 Encounter for screening for malignant neoplasm of cervix: Secondary | ICD-10-CM

## 2018-04-12 DIAGNOSIS — Z01419 Encounter for gynecological examination (general) (routine) without abnormal findings: Secondary | ICD-10-CM | POA: Diagnosis not present

## 2018-04-12 DIAGNOSIS — Z3041 Encounter for surveillance of contraceptive pills: Secondary | ICD-10-CM

## 2018-04-12 DIAGNOSIS — F329 Major depressive disorder, single episode, unspecified: Secondary | ICD-10-CM

## 2018-04-12 DIAGNOSIS — F32A Depression, unspecified: Secondary | ICD-10-CM

## 2018-04-12 DIAGNOSIS — F419 Anxiety disorder, unspecified: Secondary | ICD-10-CM

## 2018-04-12 NOTE — Patient Instructions (Signed)
I value your feedback and entrusting us with your care. If you get a [place] patient survey, I would appreciate you taking the time to let us know about your experience today. Thank you! 

## 2018-04-12 NOTE — Progress Notes (Addendum)
PCP:  McLean-Scocuzza, Pasty Spillers, MD   Chief Complaint  Patient presents with  . Gynecologic Exam     HPI:      [name] is a 32 y.o. [id number] who LMP was Patient's last menstrual period was 04/06/2018 (exact date)., presents today for her annual examination.  Her menses are irreg since changing to Lo Loestrin 3/19. On 3rd pack of pills. Having light bleeding/spotting; no bleeding during placebo pills. Still having breast tenderness/water retention sx even though on lower dose pill from previous 30 mcg pill. Dysmenorrhea mild, occurring throughout cycle. Has issues with decreased libido and thinks sx may be slightly improved with Lo Lo. Willing to give it more time to control cycle.  Sex activity: single partner, contraception - OCP (estrogen/progesterone).  Last Pap: September 23, 2015  Results were: no abnormalities  Hx of STDs: none  There is no FH of breast cancer. There is no FH of ovarian cancer. The patient does do self-breast exams.  Tobacco use: The patient denies current or previous tobacco use. Alcohol use: social drinker No drug use.  Exercise: moderately active  She does get adequate calcium and Vitamin D in her diet.  Labs with PCP. Thyroid meds slightly adjusted by endocrine recently and pt due for TSH rechk in about 6 wks.  Has anxiety/depression, doing pretty well with wellbutrin and buspar combination. Managed by PCP now.   Past Medical History:  Diagnosis Date  . Anxiety   . Depression   . Hypothyroidism   . Obesity   . Thyroid disease     Past Surgical History:  Procedure Laterality Date  . CESAREAN SECTION  07/2008  . CESAREAN SECTION      Family History  Problem Relation Age of Onset  . Hyperlipidemia Mother   . Depression Mother   . Hypertension Mother   . Hypothyroidism Mother   . Thyroid disease Mother   . Hypertension Father   . Diabetes Father   . Asthma Father   . Heart disease Father   . Hyperlipidemia Father   . Lupus  Sister   . Hypothyroidism Sister   . Asthma Sister   . Miscarriages / Stillbirths Sister   . Depression Son   . Heart disease Maternal Grandmother   . Dementia Maternal Grandmother   . Thyroid disease Maternal Grandmother   . Thyroid disease Maternal Grandfather   . Colon cancer Maternal Grandfather 80  . Melanoma Maternal Grandfather 80  . Cancer Paternal Grandfather        skin   . Hashimoto's thyroiditis Sister        Thyroid nodules present, treated surgically  . Lupus Sister     Social History   Socioeconomic History  . Marital status: Married    Spouse name: Not on file  . Number of children: 1  . Years of education: Not on file  . Highest education level: Not on file  Occupational History  . 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 Financial resource strain: Not on file  . Food insecurity:    Worry: Not on file    Inability: Not on file  . Transportation needs:    Medical: Not on file    Non-medical: Not on file  Tobacco Use  . Smoking status: Former Games developer  . Smokeless tobacco: Never Used  . Tobacco comment: quit smoking 2009   Substance and Sexual Activity  . Alcohol use: Yes  .  Drug use: No  . Sexual activity: Yes    Birth control/protection: Pill  Lifestyle  . Physical activity:    Days per week: 3 days    Minutes per session: 30 min  . Stress: To some extent  Relationships  . Social connections:    Talks on phone: More than three times a week    Gets together: Three times a week    Attends religious service: More than 4 times per year    Active member of club or organization: No    Attends meetings of clubs or organizations: Never    Relationship status: Married  . Intimate partner violence:    Fear of current or ex partner: No    Emotionally abused: No    Physically abused: No    Forced sexual activity: No  Other Topics Concern  . Not on file  Social History Narrative   Wears seat belt   Feels safe in  relationship    3 kids 2 bio and 1 step son         Outpatient Medications Prior to Visit  Medication Sig Dispense Refill  . buPROPion (WELLBUTRIN XL) 300 MG 24 hr tablet TAKE 1 TABLET (300 MG TOTAL) BY MOUTH EVERY MORNING. 30 tablet 5  . busPIRone (BUSPAR) 10 MG tablet Take 1 tablet (10 mg total) by mouth 2 (two) times daily. 60 tablet 5  . levothyroxine (SYNTHROID, LEVOTHROID) 112 MCG tablet 1 tablet daily in 1-1/2 tablets on Wednesdays 32 tablet 1  . Norethindrone-Ethinyl Estradiol-Fe Biphas (LO LOESTRIN FE) 1 MG-10 MCG / 10 MCG tablet Take 1 tablet by mouth daily. SAMPLES FOR PT 28 tablet 0  . aluminum chloride (DRYSOL) 20 % external solution Try Drysol 1x at bedtime hands and feet. Once excess sweating stopped decrease to 1-2x weekly. (Patient not taking: Reported on 04/12/2018) 60 mL 11  . Cholecalciferol (VITAMIN D3) 2000 units TABS Take by mouth.    Marland Kitchen LORazepam (ATIVAN) 1 MG tablet Take 1 tablet (1 mg total) by mouth 2 (two) times daily. (Patient not taking: Reported on 04/12/2018) 20 tablet 0  . phentermine (ADIPEX-P) 37.5 MG tablet Take 1 tablet (37.5 mg total) by mouth daily before breakfast. (Patient not taking: Reported on 04/12/2018) 31 tablet 0   No facility-administered medications prior to visit.     ROS:  Review of Systems  Constitutional: Negative for fatigue, fever and unexpected weight change.  Respiratory: Negative for cough, shortness of breath and wheezing.   Cardiovascular: Negative for chest pain, palpitations and leg swelling.  Gastrointestinal: Positive for constipation. Negative for blood in stool, diarrhea, nausea and vomiting.  Endocrine: Negative for cold intolerance, heat intolerance and polyuria.  Genitourinary: Negative for dyspareunia, dysuria, flank pain, frequency, genital sores, hematuria, menstrual problem, pelvic pain, urgency, vaginal bleeding, vaginal discharge and vaginal pain.  Musculoskeletal: Negative for back pain, joint swelling and myalgias.    Skin: Negative for rash.  Neurological: Negative for dizziness, syncope, light-headedness, numbness and headaches.  Hematological: Negative for adenopathy.  Psychiatric/Behavioral: Positive for agitation and dysphoric mood. Negative for confusion, sleep disturbance and suicidal ideas. The patient is not nervous/anxious.    BREAST: No symptoms   Objective: BP 108/76   Pulse 97   Ht  (1.753 m)   Wt 209 lb (94.8 kg)   LMP 04/06/2018 (Exact Date)   Breastfeeding? No   BMI 30.86 kg/m    Physical Exam  Constitutional: She is oriented to person, place, and time. She appears well-developed and  well-nourished.  Genitourinary: Vagina normal and uterus normal. There is no rash or tenderness on the right labia. There is no rash or tenderness on the left labia. No erythema or tenderness in the vagina. No vaginal discharge found. Right adnexum does not display mass and does not display tenderness. Left adnexum does not display mass and does not display tenderness. Cervix does not exhibit motion tenderness or polyp. Uterus is not enlarged or tender.  Neck: Normal range of motion. No thyromegaly present.  Cardiovascular: Normal rate, regular rhythm and normal heart sounds.  No murmur heard. Pulmonary/Chest: Effort normal and breath sounds normal. Right breast exhibits no mass, no nipple discharge, no skin change and no tenderness. Left breast exhibits no mass, no nipple discharge, no skin change and no tenderness.  Abdominal: Soft. There is no tenderness. There is no guarding.  Musculoskeletal: Normal range of motion.  Neurological: She is alert and oriented to person, place, and time. No cranial nerve deficit.  Psychiatric: She has a normal mood and affect. Her behavior is normal.  Vitals reviewed.   Assessment/Plan: Encounter for annual routine gynecological examination  Cervical cancer screening - Plan: IGP, Aptima HPV  Screening for HPV (human papillomavirus) - Plan: IGP, Aptima  HPV  Encounter for surveillance of contraceptive pills - Will cont Lo Loestrin for another month and re-eval. Make sure TSH levels WNL.   Anxiety and depression - Sx improved with meds, followed by PCP       GYN counsel adequate intake of calcium and vitamin D, diet and exercise     F/U  Return in about 1 year (around 04/13/2019).  Faria Casella B. Caytlyn Evers, PA-C 04/12/2018 12:07 PM

## 2018-04-15 LAB — IGP, APTIMA HPV
HPV Aptima: NEGATIVE
PAP Smear Comment: 0

## 2018-04-16 ENCOUNTER — Encounter: Payer: Self-pay | Admitting: Internal Medicine

## 2018-04-17 ENCOUNTER — Other Ambulatory Visit: Payer: Self-pay | Admitting: Internal Medicine

## 2018-04-17 DIAGNOSIS — F3289 Other specified depressive episodes: Secondary | ICD-10-CM

## 2018-04-17 MED ORDER — BUPROPION HCL ER (XL) 300 MG PO TB24: 300.0000 mg | ORAL_TABLET | Freq: Every morning | ORAL | 11 refills | Status: DC

## 2018-04-18 ENCOUNTER — Other Ambulatory Visit (INDEPENDENT_AMBULATORY_CARE_PROVIDER_SITE_OTHER): Payer: No Typology Code available for payment source

## 2018-04-18 DIAGNOSIS — N23 Unspecified renal colic: Secondary | ICD-10-CM

## 2018-04-18 LAB — POCT URINALYSIS DIPSTICK
Glucose, UA: NEGATIVE
KETONES UA: NEGATIVE
NITRITE UA: NEGATIVE
PROTEIN UA: POSITIVE — AB
RBC UA: NEGATIVE
Spec Grav, UA: 1.025 (ref 1.010–1.025)
UROBILINOGEN UA: NEGATIVE U/dL — AB
pH, UA: 5 (ref 5.0–8.0)

## 2018-04-18 MED ORDER — CIPROFLOXACIN HCL 500 MG PO TABS: 500.0000 mg | ORAL_TABLET | Freq: Two times a day (BID) | ORAL | 0 refills | Status: AC

## 2018-04-18 NOTE — Progress Notes (Signed)
Pt with nausea, kidney pain since yesterday. No GI sx. Urine with odor, leuks. Pos CVAT bilat. Check C&S. Rx cipro. F/u prn.

## 2018-04-18 NOTE — Addendum Note (Signed)
Addended by: Desmond Dike on: 04/18/2018 10:42 AM   Modules accepted: Orders

## 2018-04-20 LAB — URINE CULTURE: ORGANISM ID, BACTERIA: NO GROWTH

## 2018-04-27 ENCOUNTER — Other Ambulatory Visit: Payer: Self-pay | Admitting: Internal Medicine

## 2018-04-27 ENCOUNTER — Other Ambulatory Visit: Payer: Self-pay

## 2018-04-27 DIAGNOSIS — E039 Hypothyroidism, unspecified: Secondary | ICD-10-CM

## 2018-04-28 LAB — TSH: TSH: 1.65 u[IU]/mL (ref 0.450–4.500)

## 2018-05-01 ENCOUNTER — Encounter: Payer: Self-pay | Admitting: Internal Medicine

## 2018-05-04 ENCOUNTER — Other Ambulatory Visit: Payer: Self-pay | Admitting: Internal Medicine

## 2018-05-04 ENCOUNTER — Telehealth: Payer: Self-pay

## 2018-05-04 ENCOUNTER — Other Ambulatory Visit: Payer: Self-pay

## 2018-05-04 ENCOUNTER — Encounter: Payer: Self-pay | Admitting: Internal Medicine

## 2018-05-04 DIAGNOSIS — F419 Anxiety disorder, unspecified: Secondary | ICD-10-CM

## 2018-05-04 DIAGNOSIS — E039 Hypothyroidism, unspecified: Secondary | ICD-10-CM

## 2018-05-04 DIAGNOSIS — F3289 Other specified depressive episodes: Secondary | ICD-10-CM

## 2018-05-04 MED ORDER — BUSPIRONE HCL 10 MG PO TABS: 10.0000 mg | ORAL_TABLET | Freq: Two times a day (BID) | ORAL | 3 refills | Status: DC

## 2018-05-04 MED ORDER — LEVOTHYROXINE SODIUM 112 MCG PO TABS: ORAL_TABLET | ORAL | 0 refills | Status: DC

## 2018-05-04 MED ORDER — BUPROPION HCL ER (XL) 300 MG PO TB24
300.0000 mg | ORAL_TABLET | Freq: Every morning | ORAL | 3 refills | Status: DC
Start: 2018-05-04 — End: 2019-07-03

## 2018-05-04 MED ORDER — LEVOTHYROXINE SODIUM 112 MCG PO TABS: ORAL_TABLET | ORAL | 3 refills | Status: DC

## 2018-05-04 NOTE — Telephone Encounter (Signed)
Copied from CRM [id number]. Topic: General - Other >> May 04, 2018  4:18 PM Percival SpanishKennedy, Cheryl W wrote:  Pt is requesting a refill on    levothyroxine (SYNTHROID, LEVOTHROID) 112 MCG tablet   [facility]

## 2018-07-02 ENCOUNTER — Ambulatory Visit: Payer: Self-pay | Admitting: Internal Medicine

## 2018-08-27 ENCOUNTER — Ambulatory Visit: Payer: Self-pay | Admitting: Endocrinology

## 2018-08-30 ENCOUNTER — Encounter: Payer: Self-pay | Admitting: Internal Medicine

## 2018-08-30 ENCOUNTER — Ambulatory Visit (INDEPENDENT_AMBULATORY_CARE_PROVIDER_SITE_OTHER): Payer: No Typology Code available for payment source | Admitting: Internal Medicine

## 2018-08-30 ENCOUNTER — Encounter: Payer: Self-pay | Admitting: Obstetrics and Gynecology

## 2018-08-30 VITALS — BP 128/70 | HR 84 | Temp 98.2°F | Ht 69.0 in | Wt 219.6 lb

## 2018-08-30 DIAGNOSIS — F419 Anxiety disorder, unspecified: Secondary | ICD-10-CM | POA: Diagnosis not present

## 2018-08-30 DIAGNOSIS — L659 Nonscarring hair loss, unspecified: Secondary | ICD-10-CM | POA: Diagnosis not present

## 2018-08-30 DIAGNOSIS — E611 Iron deficiency: Secondary | ICD-10-CM

## 2018-08-30 DIAGNOSIS — E669 Obesity, unspecified: Secondary | ICD-10-CM | POA: Diagnosis not present

## 2018-08-30 DIAGNOSIS — E039 Hypothyroidism, unspecified: Secondary | ICD-10-CM | POA: Diagnosis not present

## 2018-08-30 HISTORY — DX: Iron deficiency: E61.1

## 2018-08-30 MED ORDER — PHENTERMINE HCL 37.5 MG PO TABS: 37.5000 mg | ORAL_TABLET | Freq: Every day | ORAL | 0 refills | Status: DC

## 2018-08-30 NOTE — Progress Notes (Signed)
Pre visit review using our clinic review tool, if applicable. No additional management support is needed unless otherwise documented below in the visit note. 

## 2018-08-30 NOTE — Progress Notes (Signed)
Chief Complaint  Patient presents with  . Medication Refill   F/u  1. Hypothyroidism last TSH 03/2018 normal on levo 112 mg qd and 1.5 tablets weds did not f/u with endocrine. C/o hair loss and has gained 10 lbs  2. Changing jobs soon coming back to [place] to work for [employer] currently works [employer]  3. Obesity wants to try Adipex again she is waking up early in the am and exercising     Review of Systems  Constitutional: Negative for weight loss.  HENT: Negative for hearing loss.   Eyes: Negative for blurred vision.  Respiratory: Negative for shortness of breath.   Cardiovascular: Negative for chest pain.  Gastrointestinal: Negative for abdominal pain.  Skin: Negative for rash.       +hair loss   Neurological: Negative for headaches.  Psychiatric/Behavioral: Negative for depression.   Past Medical History:  Diagnosis Date  . Anxiety   . Depression   . Hypothyroidism   . Obesity   . Thyroid disease    Past Surgical History:  Procedure Laterality Date  . CESAREAN SECTION  07/2008  . CESAREAN SECTION     Family History  Problem Relation Age of Onset  . Hyperlipidemia Mother   . Depression Mother   . Hypertension Mother   . Hypothyroidism Mother   . Thyroid disease Mother   . Hypertension Father   . Diabetes Father   . Asthma Father   . Heart disease Father   . Hyperlipidemia Father   . Lupus Sister   . Hypothyroidism Sister   . Asthma Sister   . Miscarriages / Stillbirths Sister   . Depression Son   . Heart disease Maternal Grandmother   . Dementia Maternal Grandmother   . Thyroid disease Maternal Grandmother   . Thyroid disease Maternal Grandfather   . Colon cancer Maternal Grandfather 80  . Melanoma Maternal Grandfather 80  . Cancer Paternal Grandfather        skin   . Hashimoto's thyroiditis Sister        Thyroid nodules present, treated surgically  . Lupus Sister    Social History   Socioeconomic History  . Marital status: Married    Spouse name: Not  on file  . Number of children: 1  . Years of education: Not on file  . Highest education level: Not on file  Occupational History  . 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 Financial resource strain: Not on file  . Food insecurity:    Worry: Not on file    Inability: Not on file  . Transportation needs:    Medical: Not on file    Non-medical: Not on file  Tobacco Use  . Smoking status: Former Games developer  . Smokeless tobacco: Never Used  . Tobacco comment: quit smoking 2009   Substance and Sexual Activity  . Alcohol use: Yes  . Drug use: No  . Sexual activity: Yes    Birth control/protection: Pill  Lifestyle  . Physical activity:    Days per week: 3 days    Minutes per session: 30 min  . Stress: To some extent  Relationships  . Social connections:    Talks on phone: More than three times a week    Gets together: Three times a week    Attends religious service: More than 4 times per year    Active member of club or organization: No    Attends meetings of  clubs or organizations: Never    Relationship status: Married  . Intimate partner violence:    Fear of current or ex partner: No    Emotionally abused: No    Physically abused: No    Forced sexual activity: No  Other Topics Concern  . Not on file  Social History Narrative   Wears seat belt   Feels safe in relationship    3 kids 2 bio and 1 step son        Current Meds  Medication Sig  . buPROPion (WELLBUTRIN XL) 300 MG 24 hr tablet Take 1 tablet (300 mg total) by mouth every morning.  . busPIRone (BUSPAR) 10 MG tablet Take 1 tablet (10 mg total) by mouth 2 (two) times daily.  Marland Kitchen levothyroxine (SYNTHROID, LEVOTHROID) 112 MCG tablet 1 tablet daily in am and 1.5 tablets on Wednesdays  . LORazepam (ATIVAN) 1 MG tablet Take 1 tablet (1 mg total) by mouth 2 (two) times daily.  . Norethindrone-Ethinyl Estradiol-Fe Biphas (LO LOESTRIN FE) 1 MG-10 MCG / 10 MCG tablet Take 1 tablet by mouth  daily. SAMPLES FOR PT  . [DISCONTINUED] aluminum chloride (DRYSOL) 20 % external solution Try Drysol 1x at bedtime hands and feet. Once excess sweating stopped decrease to 1-2x weekly.  . [DISCONTINUED] Cholecalciferol (VITAMIN D3) 2000 units TABS Take by mouth.   No Known Allergies No results found for this or any previous visit (from the past 2160 hour(s)). Objective  Body mass index is 32.43 kg/m. Wt Readings from Last 3 Encounters:  08/30/18 219 lb 9.6 oz (99.6 kg)  04/12/18 209 lb (94.8 kg)  03/02/18 211 lb 9.6 oz (96 kg)   Temp Readings from Last 3 Encounters:  08/30/18 98.2 F (36.8 C) (Oral)  03/02/18 98.3 F (36.8 C) (Oral)  02/02/18 98.2 F (36.8 C) (Oral)   BP Readings from Last 3 Encounters:  08/30/18 128/70  04/12/18 108/76  03/02/18 112/66   Pulse Readings from Last 3 Encounters:  08/30/18 84  04/12/18 97  03/02/18 89    Physical Exam  Constitutional: She is oriented to person, place, and time. Vital signs are normal. She appears well-developed and well-nourished. She is cooperative.  HENT:  Head: Normocephalic and atraumatic.  Mouth/Throat: Oropharynx is clear and moist and mucous membranes are normal.  Eyes: Pupils are equal, round, and reactive to light. Conjunctivae are normal.  Cardiovascular: Normal rate, regular rhythm and normal heart sounds.  Pulmonary/Chest: Effort normal and breath sounds normal.  Neurological: She is alert and oriented to person, place, and time. Gait normal.  Skin: Skin is warm, dry and intact.  No hairs on hair pull test    Psychiatric: She has a normal mood and affect. Her speech is normal and behavior is normal. Judgment and thought content normal. Cognition and memory are normal.  Nursing note and vitals reviewed.   Assessment   1. Hypothyroidism  2. Obesity BMI 32.43  3. HM 4. Hair loss  Plan   1. Cont meds  Check labs today  2. adipex x 2 month supply f/u in 4-6 months refill in 2 months  3.  Flu shot had  today and quantiferon gold  Tdap in 2017  Had HPV vaccine  Pap [facility] 04/12/18 neg pap neg hpv disc irregular menses with alicia copeland  Skin appt Mikki Santee due 02/2019  Check CMET, CBC, anemia, TSH  4.  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 French Ana McLean-Scocuzza-Internal Medicine

## 2018-08-30 NOTE — Patient Instructions (Addendum)
Biotin Mvt zinc and maganese and iron 325 mg ferrous sulfate daily  Vitamin D3 1000-2000 daily   Let me know when run out of adipex

## 2018-08-31 LAB — IRON,TIBC AND FERRITIN PANEL
%SAT: 8 % — AB (ref 16–45)
FERRITIN: 9 ng/mL — AB (ref 16–154)
Iron: 35 ug/dL — ABNORMAL LOW (ref 40–190)
TIBC: 424 mcg/dL (calc) (ref 250–450)

## 2018-08-31 LAB — CBC WITH DIFFERENTIAL/PLATELET
BASOS PCT: 1.1 % (ref 0.0–3.0)
Basophils Absolute: 0.1 10*3/uL (ref 0.0–0.1)
EOS ABS: 0.1 10*3/uL (ref 0.0–0.7)
EOS PCT: 0.8 % (ref 0.0–5.0)
HCT: 38.4 % (ref 36.0–46.0)
HEMOGLOBIN: 12.7 g/dL (ref 12.0–15.0)
LYMPHS ABS: 1.9 10*3/uL (ref 0.7–4.0)
Lymphocytes Relative: 28.4 % (ref 12.0–46.0)
MCHC: 33.2 g/dL (ref 30.0–36.0)
MCV: 86.2 fl (ref 78.0–100.0)
MONO ABS: 0.6 10*3/uL (ref 0.1–1.0)
Monocytes Relative: 9.6 % (ref 3.0–12.0)
NEUTROS ABS: 3.9 10*3/uL (ref 1.4–7.7)
NEUTROS PCT: 60.1 % (ref 43.0–77.0)
PLATELETS: 308 10*3/uL (ref 150.0–400.0)
RBC: 4.46 Mil/uL (ref 3.87–5.11)
RDW: 12.7 % (ref 11.5–15.5)
WBC: 6.6 10*3/uL (ref 4.0–10.5)

## 2018-08-31 LAB — COMPREHENSIVE METABOLIC PANEL
ALBUMIN: 4.4 g/dL (ref 3.5–5.2)
ALT: 13 U/L (ref 0–35)
AST: 16 U/L (ref 0–37)
Alkaline Phosphatase: 77 U/L (ref 39–117)
BUN: 13 mg/dL (ref 6–23)
CHLORIDE: 103 meq/L (ref 96–112)
CO2: 28 meq/L (ref 19–32)
CREATININE: 0.88 mg/dL (ref 0.40–1.20)
Calcium: 9.7 mg/dL (ref 8.4–10.5)
GFR: 79.38 mL/min (ref >60.00)
GLUCOSE: 84 mg/dL (ref 70–99)
POTASSIUM: 4.1 meq/L (ref 3.5–5.1)
SODIUM: 138 meq/L (ref 135–145)
Total Bilirubin: 0.3 mg/dL (ref 0.2–1.2)
Total Protein: 7.6 g/dL (ref 6.0–8.3)

## 2018-08-31 LAB — TSH: TSH: 2.48 u[IU]/mL (ref 0.35–4.50)

## 2018-09-03 ENCOUNTER — Other Ambulatory Visit: Payer: Self-pay | Admitting: Obstetrics and Gynecology

## 2018-09-03 MED ORDER — LEVONORGESTREL-ETHINYL ESTRAD 0.1-20 MG-MCG PO TABS: 1.0000 | ORAL_TABLET | Freq: Every day | ORAL | 1 refills | Status: DC

## 2018-09-03 NOTE — Progress Notes (Signed)
OCP change due to BTB.

## 2018-10-01 ENCOUNTER — Ambulatory Visit (INDEPENDENT_AMBULATORY_CARE_PROVIDER_SITE_OTHER): Payer: Self-pay | Admitting: Physician Assistant

## 2018-10-01 ENCOUNTER — Encounter: Payer: Self-pay | Admitting: Physician Assistant

## 2018-10-01 VITALS — BP 110/84 | HR 78 | Temp 98.4°F | Wt 215.0 lb

## 2018-10-01 DIAGNOSIS — J0100 Acute maxillary sinusitis, unspecified: Secondary | ICD-10-CM

## 2018-10-01 DIAGNOSIS — R5383 Other fatigue: Secondary | ICD-10-CM

## 2018-10-01 MED ORDER — AMOXICILLIN-POT CLAVULANATE 875-125 MG PO TABS: 1.0000 | ORAL_TABLET | Freq: Two times a day (BID) | ORAL | 0 refills | Status: AC

## 2018-10-01 MED ORDER — FLUTICASONE PROPIONATE 50 MCG/ACT NA SUSP: 2.0000 | Freq: Every day | NASAL | 0 refills | Status: DC

## 2018-10-01 MED ORDER — FLUCONAZOLE 150 MG PO TABS: 150.0000 mg | ORAL_TABLET | Freq: Every day | ORAL | 0 refills | Status: AC

## 2018-10-01 NOTE — Patient Instructions (Signed)
Thank you for choosing [facility] for your health care needs.  You have been diagnosed with sinusitis.  You have been prescribed an antibiotic, Augmentin. Take as prescribed. Take with food to prevent stomach upset.  You have been prescribed a steroid nasal spray, Flonase. Will help with nasal congestion and post-nasal drip.  You have been prescribed Diflucan. May be used if antibiotic causes a vaginal yeast infection.  Increase fluids. Rest. May continue to use over the counter decongestant such as Mucinex-D or Sudafed.  Follow-up in a few days if symptoms not improving. Hope you feel better soon.  Sinusitis, Adult Sinusitis is soreness and inflammation of your sinuses. Sinuses are hollow spaces in the bones around your face. They are located:  Around your eyes.  In the middle of your forehead.  Behind your nose.  In your cheekbones.  Your sinuses and nasal passages are lined with a stringy fluid (mucus). Mucus normally drains out of your sinuses. When your nasal tissues get inflamed or swollen, the mucus can get trapped or blocked so air cannot flow through your sinuses. This lets bacteria, viruses, and funguses grow, and that leads to infection. Follow these instructions at home: Medicines  Take, use, or apply over-the-counter and prescription medicines only as told by your doctor. These may include nasal sprays.  If you were prescribed an antibiotic medicine, take it as told by your doctor. Do not stop taking the antibiotic even if you start to feel better. Hydrate and Humidify  Drink enough water to keep your pee (urine) clear or pale yellow.  Use a cool mist humidifier to keep the humidity level in your home above 50%.  Breathe in steam for 10-15 minutes, 3-4 times a day or as told by your doctor. You can do this in the bathroom while a hot shower is running.  Try not to spend time in cool or dry air. Rest  Rest as much as possible.  Sleep with your head raised  (elevated).  Make sure to get enough sleep each night. General instructions  Put a warm, moist washcloth on your face 3-4 times a day or as told by your doctor. This will help with discomfort.  Wash your hands often with soap and water. If there is no soap and water, use hand sanitizer.  Do not smoke. Avoid being around people who are smoking (secondhand smoke).  Keep all follow-up visits as told by your doctor. This is important. Contact a doctor if:  You have a fever.  Your symptoms get worse.  Your symptoms do not get better within 10 days. Get help right away if:  You have a very bad headache.  You cannot stop throwing up (vomiting).  You have pain or swelling around your face or eyes.  You have trouble seeing.  You feel confused.  Your neck is stiff.  You have trouble breathing. This information is not intended to replace advice given to you by your health care provider. Make sure you discuss any questions you have with your health care provider. Document Released: 05/02/2008 Document Revised: 07/10/2016 Document Reviewed: 09/09/2015 Elsevier Interactive Patient Education  [employer].

## 2018-10-01 NOTE — Progress Notes (Signed)
Patient ID: Kaitlyn Dalton DOB: 15-May-1986 AGE: 32 y.o. MRN: [id number]   PCP: McLean-Scocuzza, Pasty Spillers, MD   Chief Complaint: Sinus symptoms   Subjective:    HPI:  Kaitlyn Dalton is a 32 y.o. female presents for evaluation sinus symptoms.  Patient with 3 week history of sinus symptoms. Wax and wane in severity. Reports meandering headache (sometimes forehead, sometimes occipital area), posterior neck and posterior shoulder discomfort/tightness, bilateral maxillary sinus pressure/congestion, nasal congestion, postnasal drip, scratchy throat. Over past week has developed malaise/fatigue. Patient with known iron-deficiency anemia. Has been taking iron supplement as prescribed. Patient denies fever, chills, dizziness/lightheadedness, ear pain, sore throat, cough, chest pain, chest congestion/pressure/tightness, wheezing.  Patient with increased stress, due to starting new job with increased responsibility. Patient appears anxious.  Patient states husband and child with similar URI symptoms.   A complete, at least 10 system review of symptoms was performed, pertinent positives and negatives as mentioned in HPI, otherwise negative.  The following portions of the patient's history were reviewed and updated as appropriate: allergies, current medications and past medical history.  Patient Active Problem List   Diagnosis Date Noted  . Iron deficiency 08/30/2018  . Obesity (BMI 30-39.9) 08/30/2018  . Hyperhidrosis 01/05/2018  . Anxiety and depression 05/06/2016  . Obesity complicating pregnancy 05/06/2016  . Vaginal birth after cesarean delivery 05/06/2016  . Postpartum care following vaginal delivery 05/06/2016  . Hypothyroidism 10/06/2009  . OBESITY 10/06/2009  . Depression 10/06/2009    No Known Allergies  Current Outpatient Medications on File Prior to Visit  Medication Sig Dispense Refill  . buPROPion (WELLBUTRIN XL) 300 MG 24 hr tablet Take 1 tablet (300 mg  total) by mouth every morning. 90 tablet 3  . busPIRone (BUSPAR) 10 MG tablet Take 1 tablet (10 mg total) by mouth 2 (two) times daily. 180 tablet 3  . levonorgestrel-ethinyl estradiol (AVIANE) 0.1-20 MG-MCG tablet Take 1 tablet by mouth daily. 84 tablet 1  . levothyroxine (SYNTHROID, LEVOTHROID) 112 MCG tablet 1 tablet daily in am and 1.5 tablets on Wednesdays 96 tablet 3  . LORazepam (ATIVAN) 1 MG tablet Take 1 tablet (1 mg total) by mouth 2 (two) times daily. 20 tablet 0  . phentermine (ADIPEX-P) 37.5 MG tablet Take 1 tablet (37.5 mg total) by mouth daily before breakfast. 60 tablet 0   No current facility-administered medications on file prior to visit.        Objective:   Vitals:   10/01/18 1638  BP: 110/84  Pulse: 78  Temp: 98.4 F (36.9 C)  SpO2: 100%     Wt Readings from Last 3 Encounters:  10/01/18 215 lb (97.5 kg)  08/30/18 219 lb 9.6 oz (99.6 kg)  04/12/18 209 lb (94.8 kg)    Physical Exam:   General Appearance:  Alert, cooperative, appears stated age. In no acute distress. Afebrile.  Head:  Normocephalic, without obvious abnormality, atraumatic  Eyes:  PERRL, conjunctiva/corneas clear, EOM's intact, fundi benign, both eyes  Ears:  Normal TM's and external ear canals, both ears  Nose: Nares normal, septum midline. No discharge. Nasal mucosa with minimal edema. No sinus tenderness with percussion/palpation. Subjective bilateral maxillary sinus pressure.   Throat: Lips, mucosa, and tongue normal; teeth and gums normal. Throat reveals no erythema. Tonsils with no enlargement or exudate.  Neck: Supple, symmetrical, trachea midline, no adenopathy;  thyroid: not enlarged, symmetric. Patient with posterior neck discomfort; stretching movements bring relief.  Back:   Symmetric, no curvature, ROM normal, no CVA tenderness  Lungs:   Clear to auscultation bilaterally, respirations unlabored  Heart:  Regular rate and rhythm, S1 and S2 normal, no murmur, rub, or gallop   Abdomen:   Soft, non-tender, bowel sounds active all four quadrants,  no masses, no organomegaly  Extremities: Extremities normal, atraumatic, no cyanosis or edema  Pulses: 2+ and symmetric  Skin: Skin color, texture, turgor normal, no rashes or lesions  Lymph nodes: Cervical, supraclavicular, and axillary nodes normal  Neurologic: Normal. Appears anxious/emotional (tearful when discussing fatigue).    Assessment & Plan:    Exam findings, diagnosis etiology and medication use and indications reviewed with patient. Follow-Up and discharge instructions provided. No emergent/urgent issues found on exam.  Patient education was provided.   Patient verbalized understanding of information provided and agrees with plan of care (POC), all questions answered. The patient is advised to call or return to clinic if condition does not see an improvement in symptoms, or to seek the care of the closest emergency department if condition worsens with the below plan.   1. Acute non-recurrent maxillary sinusitis  - amoxicillin-clavulanate (AUGMENTIN) 875-125 MG tablet; Take 1 tablet by mouth 2 (two) times daily for 10 days.  Dispense: 20 tablet; Refill: 0 - fluticasone (FLONASE) 50 MCG/ACT nasal spray; Place 2 sprays into both nostrils daily.  Dispense: 16 g; Refill: 0 - fluconazole (DIFLUCAN) 150 MG tablet; Take 1 tablet (150 mg total) by mouth daily for 2 doses. Take 1 pill on day 1, repeat 3 days later if still having symptoms.  Dispense: 2 tablet; Refill: 0  2. Fatigue, unspecified type  Patient with 3 weeks of sinus symptoms. Antibiotic warranted at this time (Augmentin). Also prescribed Diflucan (history of vaginal yeast infection secondary to antibiotics), and steroid nasal spray for main complaint of phlegm in throat/PND.  Discussed with patient, possible fatigue is secondary to sinusitis. However, advised further evaluation with PCP if fatigue is due to iron deficiency anemia, increased stress  (patient on Buspar, Wellbutrin, and Ativan), or other etiology.   Janalyn Harder, MHS, PA-C Rulon Sera, MHS, PA-C Advanced Practice Provider [facility]  [address] [facility], [address] [place], [place] [address] (p):  [phone] [email or web address]

## 2018-10-04 ENCOUNTER — Telehealth: Payer: Self-pay | Admitting: Emergency Medicine

## 2018-10-04 NOTE — Telephone Encounter (Signed)
Follow up call from visit with [facility]. Spoke with patient whom stated that she is doing a little bit better drainage and mucus not as thick. But still feels run down.

## 2018-10-30 ENCOUNTER — Other Ambulatory Visit: Payer: Self-pay | Admitting: Internal Medicine

## 2018-10-30 DIAGNOSIS — E669 Obesity, unspecified: Secondary | ICD-10-CM

## 2018-10-30 MED ORDER — PHENTERMINE HCL 37.5 MG PO TABS: 37.5000 mg | ORAL_TABLET | Freq: Every day | ORAL | 0 refills | Status: DC

## 2018-12-25 ENCOUNTER — Ambulatory Visit (INDEPENDENT_AMBULATORY_CARE_PROVIDER_SITE_OTHER): Payer: No Typology Code available for payment source | Admitting: Internal Medicine

## 2018-12-25 VITALS — BP 126/64 | HR 94 | Temp 98.1°F | Ht 69.0 in | Wt 215.0 lb

## 2018-12-25 DIAGNOSIS — F329 Major depressive disorder, single episode, unspecified: Secondary | ICD-10-CM

## 2018-12-25 DIAGNOSIS — Z6831 Body mass index (BMI) 31.0-31.9, adult: Secondary | ICD-10-CM

## 2018-12-25 DIAGNOSIS — Z0184 Encounter for antibody response examination: Secondary | ICD-10-CM

## 2018-12-25 DIAGNOSIS — R809 Proteinuria, unspecified: Secondary | ICD-10-CM

## 2018-12-25 DIAGNOSIS — Z13220 Encounter for screening for lipoid disorders: Secondary | ICD-10-CM

## 2018-12-25 DIAGNOSIS — N926 Irregular menstruation, unspecified: Secondary | ICD-10-CM

## 2018-12-25 DIAGNOSIS — Z0000 Encounter for general adult medical examination without abnormal findings: Secondary | ICD-10-CM

## 2018-12-25 DIAGNOSIS — F419 Anxiety disorder, unspecified: Secondary | ICD-10-CM

## 2018-12-25 DIAGNOSIS — E611 Iron deficiency: Secondary | ICD-10-CM

## 2018-12-25 DIAGNOSIS — E669 Obesity, unspecified: Secondary | ICD-10-CM

## 2018-12-25 DIAGNOSIS — Z1159 Encounter for screening for other viral diseases: Secondary | ICD-10-CM

## 2018-12-25 DIAGNOSIS — E559 Vitamin D deficiency, unspecified: Secondary | ICD-10-CM

## 2018-12-25 DIAGNOSIS — F32A Depression, unspecified: Secondary | ICD-10-CM

## 2018-12-25 DIAGNOSIS — E039 Hypothyroidism, unspecified: Secondary | ICD-10-CM

## 2018-12-25 NOTE — Progress Notes (Signed)
Pre visit review using our clinic review tool, if applicable. No additional management support is needed unless otherwise documented below in the visit note. 

## 2018-12-25 NOTE — Patient Instructions (Signed)
Call me back in 3 months when ready to resume Adipex   Exercising to Lose Weight Exercise is structured, repetitive physical activity to improve fitness and health. Getting regular exercise is important for everyone. It is especially important if you are overweight. Being overweight increases your risk of heart disease, stroke, diabetes, high blood pressure, and several types of cancer. Reducing your calorie intake and exercising can help you lose weight. Exercise is usually categorized as moderate or vigorous intensity. To lose weight, most people need to do a certain amount of moderate-intensity or vigorous-intensity exercise each week. Moderate-intensity exercise  Moderate-intensity exercise is any activity that gets you moving enough to burn at least three times more energy (calories) than if you were sitting. Examples of moderate exercise include:  Walking a mile in 15 minutes.  Doing light yard work.  Biking at an easy pace. Most people should get at least 150 minutes (2 hours and 30 minutes) a week of moderate-intensity exercise to maintain their body weight. Vigorous-intensity exercise Vigorous-intensity exercise is any activity that gets you moving enough to burn at least six times more calories than if you were sitting. When you exercise at this intensity, you should be working hard enough that you are not able to carry on a conversation. Examples of vigorous exercise include:  Running.  Playing a team sport, such as football, basketball, and soccer.  Jumping rope. Most people should get at least 75 minutes (1 hour and 15 minutes) a week of vigorous-intensity exercise to maintain their body weight. How can exercise affect me? When you exercise enough to burn more calories than you eat, you lose weight. Exercise also reduces body fat and builds muscle. The more muscle you have, the more calories you burn. Exercise also:  Improves mood.  Reduces stress and tension.  Improves  your overall fitness, flexibility, and endurance.  Increases bone strength. The amount of exercise you need to lose weight depends on:  Your age.  The type of exercise.  Any health conditions you have.  Your overall physical ability. Talk to your health care provider about how much exercise you need and what types of activities are safe for you. What actions can I take to lose weight? Nutrition   Make changes to your diet as told by your health care provider or diet and nutrition specialist (dietitian). This may include: ? Eating fewer calories. ? Eating more protein. ? Eating less unhealthy fats. ? Eating a diet that includes fresh fruits and vegetables, whole grains, low-fat dairy products, and lean protein. ? Avoiding foods with added fat, salt, and sugar.  Drink plenty of water while you exercise to prevent dehydration or heat stroke. Activity  Choose an activity that you enjoy and set realistic goals. Your health care provider can help you make an exercise plan that works for you.  Exercise at a moderate or vigorous intensity most days of the week. ? The intensity of exercise may vary from person to person. You can tell how intense a workout is for you by paying attention to your breathing and heartbeat. Most people will notice their breathing and heartbeat get faster with more intense exercise.  Do resistance training twice each week, such as: ? Push-ups. ? Sit-ups. ? Lifting weights. ? Using resistance bands.  Getting short amounts of exercise can be just as helpful as long structured periods of exercise. If you have trouble finding time to exercise, try to include exercise in your daily routine. ? Get up, stretch,  and walk around every 30 minutes throughout the day. ? Go for a walk during your lunch break. ? Park your car farther away from your destination. ? If you take public transportation, get off one stop early and walk the rest of the way. ? Make phone calls  while standing up and walking around. ? Take the stairs instead of elevators or escalators.  Wear comfortable clothes and shoes with good support.  Do not exercise so much that you hurt yourself, feel dizzy, or get very short of breath. Where to find more information  U.S. Department of Health and Human Services: ThisPath.fi  Centers for Disease Control and Prevention (CDC): FootballExhibition.com.br Contact a health care provider:  Before starting a new exercise program.  If you have questions or concerns about your weight.  If you have a medical problem that keeps you from exercising. Get help right away if you have any of the following while exercising:  Injury.  Dizziness.  Difficulty breathing or shortness of breath that does not go away when you stop exercising.  Chest pain.  Rapid heartbeat. Summary  Being overweight increases your risk of heart disease, stroke, diabetes, high blood pressure, and several types of cancer.  Losing weight happens when you burn more calories than you eat.  Reducing the amount of calories you eat in addition to getting regular moderate or vigorous exercise each week helps you lose weight. This information is not intended to replace advice given to you by your health care provider. Make sure you discuss any questions you have with your health care provider. Document Released: 12/17/2010 Document Revised: 11/27/2017 Document Reviewed: 11/27/2017 Elsevier Interactive Patient Education  2019 [employer].  Mediterranean Diet A Mediterranean diet refers to food and lifestyle choices that are based on the traditions of countries located on the [place]. This way of eating has been shown to help prevent certain conditions and improve outcomes for people who have chronic diseases, like kidney disease and heart disease. What are tips for following this plan? Lifestyle  Cook and eat meals together with your family, when possible.  Drink enough  fluid to keep your urine clear or pale yellow.  Be physically active every day. This includes: ? Aerobic exercise like running or swimming. ? Leisure activities like gardening, walking, or housework.  Get 7-8 hours of sleep each night.  If recommended by your health care provider, drink red wine in moderation. This means 1 glass a day for nonpregnant women and 2 glasses a day for men. A glass of wine equals 5 oz (150 mL). Reading food labels   Check the serving size of packaged foods. For foods such as rice and pasta, the serving size refers to the amount of cooked product, not dry.  Check the total fat in packaged foods. Avoid foods that have saturated fat or trans fats.  Check the ingredients list for added sugars, such as corn syrup. Shopping  At the grocery store, buy most of your food from the areas near the walls of the store. This includes: ? Fresh fruits and vegetables (produce). ? Grains, beans, nuts, and seeds. Some of these may be available in unpackaged forms or large amounts (in bulk). ? Fresh seafood. ? Poultry and eggs. ? Low-fat dairy products.  Buy whole ingredients instead of prepackaged foods.  Buy fresh fruits and vegetables in-season from local farmers markets.  Buy frozen fruits and vegetables in resealable bags.  If you do not have access to quality fresh seafood,  buy precooked frozen shrimp or canned fish, such as tuna, salmon, or sardines.  Buy small amounts of raw or cooked vegetables, salads, or olives from the deli or salad bar at your store.  Stock your pantry so you always have certain foods on hand, such as olive oil, canned tuna, canned tomatoes, rice, pasta, and beans. Cooking  Cook foods with extra-virgin olive oil instead of using butter or other vegetable oils.  Have meat as a side dish, and have vegetables or grains as your main dish. This means having meat in small portions or adding small amounts of meat to foods like pasta or  stew.  Use beans or vegetables instead of meat in common dishes like chili or lasagna.  Experiment with different cooking methods. Try roasting or broiling vegetables instead of steaming or sauteing them.  Add frozen vegetables to soups, stews, pasta, or rice.  Add nuts or seeds for added healthy fat at each meal. You can add these to yogurt, salads, or vegetable dishes.  Marinate fish or vegetables using olive oil, lemon juice, garlic, and fresh herbs. Meal planning   Plan to eat 1 vegetarian meal one day each week. Try to work up to 2 vegetarian meals, if possible.  Eat seafood 2 or more times a week.  Have healthy snacks readily available, such as: ? Vegetable sticks with hummus. ? [place] yogurt. ? Fruit and nut trail mix.  Eat balanced meals throughout the week. This includes: ? Fruit: 2-3 servings a day ? Vegetables: 4-5 servings a day ? Low-fat dairy: 2 servings a day ? Fish, poultry, or lean meat: 1 serving a day ? Beans and legumes: 2 or more servings a week ? Nuts and seeds: 1-2 servings a day ? Whole grains: 6-8 servings a day ? Extra-virgin olive oil: 3-4 servings a day  Limit red meat and sweets to only a few servings a month What are my food choices?  Mediterranean diet ? Recommended ? Grains: Whole-grain pasta. Brown rice. Bulgar wheat. Polenta. Couscous. Whole-wheat bread. Orpah Cobb. ? Vegetables: Artichokes. Beets. Broccoli. Cabbage. Carrots. Eggplant. Green beans. Chard. Kale. Spinach. Onions. Leeks. Peas. Squash. Tomatoes. Peppers. Radishes. ? Fruits: Apples. Apricots. Avocado. Berries. Bananas. Cherries. Dates. Figs. Grapes. Lemons. Melon. Oranges. Peaches. Plums. Pomegranate. ? Meats and other protein foods: Beans. Almonds. Sunflower seeds. Pine nuts. Peanuts. Cod. Salmon. Scallops. Shrimp. Tuna. Tilapia. Clams. Oysters. Eggs. ? Dairy: Low-fat milk. Cheese. Greek yogurt. ? Beverages: Water. Red wine. Herbal tea. ? Fats and oils: Extra virgin olive  oil. Avocado oil. Grape seed oil. ? Sweets and desserts: [place] yogurt with honey. Baked apples. Poached pears. Trail mix. ? Seasoning and other foods: Basil. Cilantro. Coriander. Cumin. Mint. Parsley. Sage. Rosemary. Tarragon. Garlic. Oregano. Thyme. Pepper. Balsalmic vinegar. Tahini. Hummus. Tomato sauce. Olives. Mushrooms. ? Limit these ? Grains: Prepackaged pasta or rice dishes. Prepackaged cereal with added sugar. ? Vegetables: Deep fried potatoes (french fries). ? Fruits: Fruit canned in syrup. ? Meats and other protein foods: Beef. Pork. Lamb. Poultry with skin. Hot dogs. Tomasa Blase. ? Dairy: Ice cream. Sour cream. Whole milk. ? Beverages: Juice. Sugar-sweetened soft drinks. Beer. Liquor and spirits. ? Fats and oils: Butter. Canola oil. Vegetable oil. Beef fat (tallow). Lard. ? Sweets and desserts: Cookies. Cakes. Pies. Candy. ? Seasoning and other foods: Mayonnaise. Premade sauces and marinades. ? The items listed may not be a complete list. Talk with your dietitian about what dietary choices are right for you. Summary  The Mediterranean diet includes both food  and lifestyle choices.  Eat a variety of fresh fruits and vegetables, beans, nuts, seeds, and whole grains.  Limit the amount of red meat and sweets that you eat.  Talk with your health care provider about whether it is safe for you to drink red wine in moderation. This means 1 glass a day for nonpregnant women and 2 glasses a day for men. A glass of wine equals 5 oz (150 mL). This information is not intended to replace advice given to you by your health care provider. Make sure you discuss any questions you have with your health care provider. Document Released: 07/07/2016 Document Revised: 08/09/2016 Document Reviewed: 07/07/2016 Elsevier Interactive Patient Education  2019 [employer]

## 2018-12-26 ENCOUNTER — Encounter: Payer: Self-pay | Admitting: Internal Medicine

## 2018-12-26 NOTE — Progress Notes (Signed)
Chief Complaint  Patient presents with  . Follow-up   F/u  1. Obesity BMI 31.75 lowest wt she has been in 10 years is <200 lbs since had a baby parents have weight issues and sister but she has lupus on steroids. Weight bothers her b/c for last 3 weeks working out and eating right w/on change in wt. She has about 2 more weeks left of adipex of 4 months. Declines to use Saxenda 2. Iron def anemia otc iron caused constipation so now taking iron in mvt  3. Anxiety increased with family stressors with mom relationship, step sons dad has leukemia so step son living with her more and at times is not nice to her but is nice to his parents. On buspar 10 mg bid and wellbutrin but does not want to change therapy or doses. [facility]. A lot of stress since Xmas and she was sick x 2 with sinus infection and husbands GF died in [place] and was unable to go with husband 4. Normal cycles but spotting in the middle of cycle on Aviane-will [facility] to see if they have recs   Review of Systems  Constitutional: Negative for weight loss.  HENT: Negative for hearing loss.   Eyes: Negative for blurred vision.  Respiratory: Negative for shortness of breath.   Cardiovascular: Negative for chest pain.  Gastrointestinal: Negative for abdominal pain.  Musculoskeletal: Negative for falls.  Skin: Negative for rash.  Neurological: Negative for headaches.  Psychiatric/Behavioral: The patient is nervous/anxious.        +stress    Past Medical History:  Diagnosis Date  . Anxiety   . Depression   . Hypothyroidism   . Iron deficiency   . Obesity   . Thyroid disease    Past Surgical History:  Procedure Laterality Date  . CESAREAN SECTION  07/2008  . CESAREAN SECTION     Family History  Problem Relation Age of Onset  . Hyperlipidemia Mother   . Depression Mother   . Hypertension Mother   . Hypothyroidism Mother   . Thyroid disease Mother   . Hypertension Father   .  Diabetes Father   . Asthma Father   . Heart disease Father   . Hyperlipidemia Father   . Lupus Sister   . Hypothyroidism Sister   . Asthma Sister   . Miscarriages / Stillbirths Sister   . Depression Son   . Heart disease Maternal Grandmother   . Dementia Maternal Grandmother   . Thyroid disease Maternal Grandmother   . Thyroid disease Maternal Grandfather   . Colon cancer Maternal Grandfather 80  . Melanoma Maternal Grandfather 80  . Cancer Paternal Grandfather        skin   . Hashimoto's thyroiditis Sister        Thyroid nodules present, treated surgically  . Lupus Sister    Social History   Socioeconomic History  . Marital status: Married    Spouse name: Not on file  . Number of children: 1  . Years of education: Not on file  . Highest education level: Not on file  Occupational History  . Occupation: Associate Professorharmacy Tech at [employer]    Employer: [employer]  Social Needs  . Financial resource strain: Not on file  . Food insecurity:    Worry: Not on file    Inability: Not on file  . Transportation needs:    Medical: Not on file    Non-medical: Not  on file  Tobacco Use  . Smoking status: Former Games developer  . Smokeless tobacco: Never Used  . Tobacco comment: quit smoking 2009   Substance and Sexual Activity  . Alcohol use: Yes  . Drug use: No  . Sexual activity: Yes    Birth control/protection: Pill  Lifestyle  . Physical activity:    Days per week: 3 days    Minutes per session: 30 min  . Stress: To some extent  Relationships  . Social connections:    Talks on phone: More than three times a week    Gets together: Three times a week    Attends religious service: More than 4 times per year    Active member of club or organization: No    Attends meetings of clubs or organizations: Never    Relationship status: Married  . Intimate partner violence:    Fear of current or ex partner: No    Emotionally abused: No    Physically abused: No    Forced sexual activity: No   Other Topics Concern  . 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 buPROPion (WELLBUTRIN XL) 300 MG 24 hr tablet Take 1 tablet (300 mg total) by mouth every morning.  . busPIRone (BUSPAR) 10 MG tablet Take 1 tablet (10 mg total) by mouth 2 (two) times daily.  . fluticasone (FLONASE) 50 MCG/ACT nasal spray Place 2 sprays into both nostrils daily.  Marland Kitchen levonorgestrel-ethinyl estradiol (AVIANE) 0.1-20 MG-MCG tablet Take 1 tablet by mouth daily.  Marland Kitchen levothyroxine (SYNTHROID, LEVOTHROID) 112 MCG tablet 1 tablet daily in am and 1.5 tablets on Wednesdays  . phentermine (ADIPEX-P) 37.5 MG tablet Take 1 tablet (37.5 mg total) by mouth daily before breakfast.   No Known Allergies No results found for this or any previous visit (from the past 2160 hour(s)). Objective  Body mass index is 31.75 kg/m. Wt Readings from Last 3 Encounters:  12/25/18 215 lb (97.5 kg)  10/01/18 215 lb (97.5 kg)  08/30/18 219 lb 9.6 oz (99.6 kg)   Temp Readings from Last 3 Encounters:  12/25/18 98.1 F (36.7 C)  10/01/18 98.4 F (36.9 C)  08/30/18 98.2 F (36.8 C) (Oral)   BP Readings from Last 3 Encounters:  12/25/18 126/64  10/01/18 110/84  08/30/18 128/70   Pulse Readings from Last 3 Encounters:  12/25/18 94  10/01/18 78  08/30/18 84    Physical Exam Vitals signs and nursing note reviewed.  Constitutional:      Appearance: Normal appearance. She is well-developed. She is obese.  HENT:     Head: Normocephalic and atraumatic.     Nose: Nose normal.     Mouth/Throat:     Mouth: Mucous membranes are moist.     Pharynx: Oropharynx is clear.  Eyes:     Conjunctiva/sclera: Conjunctivae normal.     Pupils: Pupils are equal, round, and reactive to light.  Cardiovascular:     Rate and Rhythm: Normal rate and regular rhythm.     Heart sounds: Normal heart  sounds. No murmur.  Pulmonary:     Effort: Pulmonary effort is normal.     Breath sounds: Normal breath sounds. No wheezing.  Skin:    General: Skin is warm and dry.  Neurological:  General: No focal deficit present.     Mental Status: She is alert and oriented to person, place, and time. Mental status is at baseline.     Gait: Gait normal.  Psychiatric:        Attention and Perception: Attention and perception normal.        Mood and Affect: Mood and affect normal.        Speech: Speech normal.        Behavior: Behavior normal. Behavior is cooperative.        Thought Content: Thought content normal.        Cognition and Memory: Cognition and memory normal.        Judgment: Judgment normal.     Assessment   1. Obesity  2. Iron def anemia  3. Anxiety h/o depression 4. Regular menses with spotting in mid cycle 5. HM Plan   1. rec increase cardio and healthy diet  Declines [facility] for now  adipex 4 month run almost done will need 3 month break then can resume 4 months  2. Check labs upcoming  3. Cont meds no change disc therapy at [facility], given list of female therapists  4. Will [facility]  5.  Flu shot utd  Tdap in 2017  Had HPV vaccine  Pap [facility] 04/12/18 neg pap neg hpv disc irregular menses with alicia copeland  Skin appt Mikki Santee due 02/2019  Check fasting labs upcoming    Provider: Dr. French Ana McLean-Scocuzza-Internal Medicine

## 2019-01-02 ENCOUNTER — Telehealth: Payer: Self-pay | Admitting: Obstetrics and Gynecology

## 2019-01-02 NOTE — Telephone Encounter (Signed)
Spoke with pt re:BTB with Aviane. Pt did well on this pill in past. Not having bleeding on placebo pills but bleeding mid-cycle for 2 packs. Pt wanted to wait another cycle. Had neg UPT.

## 2019-02-12 ENCOUNTER — Other Ambulatory Visit: Payer: Self-pay

## 2019-02-12 ENCOUNTER — Encounter: Payer: Self-pay | Admitting: Obstetrics and Gynecology

## 2019-02-12 MED ORDER — LEVONORGESTREL-ETHINYL ESTRAD 0.1-20 MG-MCG PO TABS: 1.0000 | ORAL_TABLET | Freq: Every day | ORAL | 0 refills | Status: DC

## 2019-03-22 ENCOUNTER — Other Ambulatory Visit: Payer: Self-pay | Admitting: Internal Medicine

## 2019-03-22 ENCOUNTER — Telehealth: Payer: Self-pay | Admitting: Internal Medicine

## 2019-03-22 DIAGNOSIS — E611 Iron deficiency: Secondary | ICD-10-CM

## 2019-03-22 DIAGNOSIS — Z13220 Encounter for screening for lipoid disorders: Secondary | ICD-10-CM

## 2019-03-22 DIAGNOSIS — E039 Hypothyroidism, unspecified: Secondary | ICD-10-CM

## 2019-03-22 DIAGNOSIS — R809 Proteinuria, unspecified: Secondary | ICD-10-CM

## 2019-03-22 DIAGNOSIS — Z1159 Encounter for screening for other viral diseases: Secondary | ICD-10-CM

## 2019-03-22 DIAGNOSIS — E559 Vitamin D deficiency, unspecified: Secondary | ICD-10-CM

## 2019-03-22 DIAGNOSIS — Z0000 Encounter for general adult medical examination without abnormal findings: Secondary | ICD-10-CM

## 2019-03-22 DIAGNOSIS — Z0184 Encounter for antibody response examination: Secondary | ICD-10-CM

## 2019-03-22 NOTE — Telephone Encounter (Signed)
This patient was on my list and has not been here in a while, she did schedule a virtual appt with you and she has future labs that looks like she never came in to do. She wants to know if you could do [facility] in [place] for her labs because she works very close to the building and wants it there. Let me know and I informed her I'll call her back. Thanks nina,cma   ADVISE pt labs orders are in [facility]   Thanks TMS

## 2019-03-22 NOTE — Telephone Encounter (Signed)
-----   Message from Allean Found, CMA sent at 03/22/2019  2:15 PM EDT ----- Regarding: labs This patient was on my list and has not been here in a while, she did schedule a virtual appt with you and she has future labs that looks like she never came in to do.  She wants to know if you could do [facility] in [place] for her labs because she works very close to the building and wants it there.  Let me know and I informed her I'll call her back.  Thanks nina,cma

## 2019-03-22 NOTE — Telephone Encounter (Signed)
Labs ordered [facility]

## 2019-03-22 NOTE — Addendum Note (Signed)
Addended by: Quentin Ore on: 03/22/2019 04:45 PM   Modules accepted: Orders

## 2019-03-25 NOTE — Telephone Encounter (Signed)
Mychart sent to patient.

## 2019-03-26 LAB — CBC WITH DIFFERENTIAL/PLATELET
Basophils Absolute: 0.1 10*3/uL (ref 0.0–0.2)
Basos: 1 %
EOS (ABSOLUTE): 0.1 10*3/uL (ref 0.0–0.4)
Eos: 2 %
Hematocrit: 41.7 % (ref 34.0–46.6)
Hemoglobin: 13.6 g/dL (ref 11.1–15.9)
Immature Grans (Abs): 0 10*3/uL (ref 0.0–0.1)
Immature Granulocytes: 0 %
Lymphocytes Absolute: 2.1 10*3/uL (ref 0.7–3.1)
Lymphs: 39 %
MCH: 28 pg (ref 26.6–33.0)
MCHC: 32.6 g/dL (ref 31.5–35.7)
MCV: 86 fL (ref 79–97)
Monocytes Absolute: 0.4 10*3/uL (ref 0.1–0.9)
Monocytes: 7 %
Neutrophils Absolute: 2.7 10*3/uL (ref 1.4–7.0)
Neutrophils: 51 %
Platelets: 311 10*3/uL (ref 150–450)
RBC: 4.85 x10E6/uL (ref 3.77–5.28)
RDW: 12 % (ref 11.7–15.4)
WBC: 5.3 10*3/uL (ref 3.4–10.8)

## 2019-03-26 LAB — URINALYSIS, ROUTINE W REFLEX MICROSCOPIC
Bilirubin, UA: NEGATIVE
Glucose, UA: NEGATIVE
Ketones, UA: NEGATIVE
Nitrite, UA: NEGATIVE
Protein,UA: NEGATIVE
RBC, UA: NEGATIVE
Specific Gravity, UA: 1.027 (ref 1.005–1.030)
Urobilinogen, Ur: 0.2 mg/dL (ref 0.2–1.0)
pH, UA: 5.5 (ref 5.0–7.5)

## 2019-03-26 LAB — THYROID PEROXIDASE ANTIBODY: Thyroperoxidase Ab SerPl-aCnc: 74 IU/mL — ABNORMAL HIGH (ref 0–34)

## 2019-03-26 LAB — LIPID PANEL
Chol/HDL Ratio: 3.8 ratio (ref 0.0–4.4)
Cholesterol, Total: 218 mg/dL — ABNORMAL HIGH (ref 100–199)
HDL: 57 mg/dL (ref >39)
LDL Calculated: 143 mg/dL — ABNORMAL HIGH (ref 0–99)
Triglycerides: 88 mg/dL (ref 0–149)
VLDL Cholesterol Cal: 18 mg/dL (ref 5–40)

## 2019-03-26 LAB — COMPREHENSIVE METABOLIC PANEL
ALT: 17 IU/L (ref 0–32)
AST: 16 IU/L (ref 0–40)
Albumin/Globulin Ratio: 2 (ref 1.2–2.2)
Albumin: 4.7 g/dL (ref 3.8–4.8)
Alkaline Phosphatase: 79 IU/L (ref 39–117)
BUN/Creatinine Ratio: 13 (ref 9–23)
BUN: 13 mg/dL (ref 6–20)
Bilirubin Total: 0.4 mg/dL (ref 0.0–1.2)
CO2: 21 mmol/L (ref 20–29)
Calcium: 9.6 mg/dL (ref 8.7–10.2)
Chloride: 102 mmol/L (ref 96–106)
Creatinine, Ser: 1 mg/dL (ref 0.57–1.00)
GFR calc Af Amer: 86 mL/min/{1.73_m2} (ref >59)
GFR calc non Af Amer: 75 mL/min/{1.73_m2} (ref >59)
Globulin, Total: 2.4 g/dL (ref 1.5–4.5)
Glucose: 78 mg/dL (ref 65–99)
Potassium: 4.4 mmol/L (ref 3.5–5.2)
Sodium: 139 mmol/L (ref 134–144)
Total Protein: 7.1 g/dL (ref 6.0–8.5)

## 2019-03-26 LAB — IRON,TIBC AND FERRITIN PANEL
Ferritin: 26 ng/mL (ref 15–150)
Iron Saturation: 19 % (ref 15–55)
Iron: 80 ug/dL (ref 27–159)
Total Iron Binding Capacity: 418 ug/dL (ref 250–450)
UIBC: 338 ug/dL (ref 131–425)

## 2019-03-26 LAB — TSH: TSH: 1.42 u[IU]/mL (ref 0.450–4.500)

## 2019-03-26 LAB — VITAMIN D 25 HYDROXY (VIT D DEFICIENCY, FRACTURES): Vit D, 25-Hydroxy: 37.9 ng/mL (ref 30.0–100.0)

## 2019-03-26 LAB — MICROSCOPIC EXAMINATION
Casts: NONE SEEN /lpf
Epithelial Cells (non renal): >10 /hpf — AB (ref 0–10)

## 2019-03-26 LAB — MEASLES/MUMPS/RUBELLA IMMUNITY
MUMPS ABS, IGG: 63.9 AU/mL (ref >10.9)
RUBEOLA AB, IGG: 246 AU/mL (ref >16.4)
Rubella Antibodies, IGG: 1.64 index (ref >0.99)

## 2019-03-26 LAB — T4, FREE: Free T4: 1.52 ng/dL (ref 0.82–1.77)

## 2019-03-29 ENCOUNTER — Encounter: Payer: Self-pay | Admitting: Obstetrics and Gynecology

## 2019-03-29 ENCOUNTER — Ambulatory Visit (INDEPENDENT_AMBULATORY_CARE_PROVIDER_SITE_OTHER): Payer: No Typology Code available for payment source | Admitting: Internal Medicine

## 2019-03-29 DIAGNOSIS — E611 Iron deficiency: Secondary | ICD-10-CM

## 2019-03-29 DIAGNOSIS — F32A Depression, unspecified: Secondary | ICD-10-CM

## 2019-03-29 DIAGNOSIS — E669 Obesity, unspecified: Secondary | ICD-10-CM | POA: Diagnosis not present

## 2019-03-29 DIAGNOSIS — F419 Anxiety disorder, unspecified: Secondary | ICD-10-CM

## 2019-03-29 DIAGNOSIS — E039 Hypothyroidism, unspecified: Secondary | ICD-10-CM

## 2019-03-29 DIAGNOSIS — E785 Hyperlipidemia, unspecified: Secondary | ICD-10-CM | POA: Insufficient documentation

## 2019-03-29 DIAGNOSIS — F329 Major depressive disorder, single episode, unspecified: Secondary | ICD-10-CM

## 2019-03-29 MED ORDER — PHENTERMINE HCL 37.5 MG PO TABS: 37.5000 mg | ORAL_TABLET | Freq: Every day | ORAL | 0 refills | Status: DC

## 2019-03-29 MED FILL — buPROPion HCL ER (XL) 300 M: 300 | 30 days supply | Qty: 30 | Fill #0

## 2019-03-29 MED FILL — PHENTERMINE 37.5 MG TABLET: 37.5 | 60 days supply | Qty: 60 | Fill #0

## 2019-03-29 MED FILL — LEVOTHYROXINE 112 MCG TAB: 112 | 90 days supply | Qty: 96 | Fill #0

## 2019-03-29 NOTE — Progress Notes (Signed)
Virtual Visit via Video Note  I connected with Kaitlyn Dalton  on 03/29/19 at  9:05 AM EDT by a video enabled telemedicine application and verified that I am speaking with the correct person using two identifiers.  Location patient: home Location provider:work or home office Persons participating in the virtual visit: patient, provider, pts son Darcella Cheshire  I discussed the limitations of evaluation and management by telemedicine and the availability of in person appointments. The patient expressed understanding and agreed to proceed.   HPI: 1. Obesity weight down to 212.6 BP 116/77 HR 74 she wants to resume Adipex will sent to [place]  2. Iron def take mvt 3x per week unable to tolerate iron more than that  3. Reviewed labs cholesterol increased she reports she is trying to eat healthy and exercise though now her gym is closed.  4. Hypothyroidism continue same meds for now  5.anxiety/depression mood ok she knows to contact [facility] if needed and will do   ROS: See pertinent positives and negatives per HPI.  Past Medical History:  Diagnosis Date  . Anxiety   . Depression   . Hypothyroidism   . Iron deficiency   . Obesity   . Thyroid disease     Past Surgical History:  Procedure Laterality Date  . CESAREAN SECTION  07/2008  . CESAREAN SECTION      Family History  Problem Relation Age of Onset  . Hyperlipidemia Mother   . Depression Mother   . Hypertension Mother   . Hypothyroidism Mother   . Thyroid disease Mother   . Hypertension Father   . Diabetes Father   . Asthma Father   . Heart disease Father   . Hyperlipidemia Father   . Lupus Sister   . Hypothyroidism Sister   . Asthma Sister   . Miscarriages / Stillbirths Sister   . Depression Son   . Heart disease Maternal Grandmother   . Dementia Maternal Grandmother   . Thyroid disease Maternal Grandmother   . Thyroid disease Maternal Grandfather   . Colon cancer Maternal Grandfather 1  . Melanoma Maternal Grandfather  80  . Cancer Paternal Grandfather        skin   . Hashimoto's thyroiditis Sister        Thyroid nodules present, treated surgically  . Lupus Sister     SOCIAL HX: married with 3 kids    Current Outpatient Medications:  .  buPROPion (WELLBUTRIN XL) 300 MG 24 hr tablet, Take 1 tablet (300 mg total) by mouth every morning., Disp: 90 tablet, Rfl: 3 .  busPIRone (BUSPAR) 10 MG tablet, Take 1 tablet (10 mg total) by mouth 2 (two) times daily., Disp: 180 tablet, Rfl: 3 .  fluticasone (FLONASE) 50 MCG/ACT nasal spray, Place 2 sprays into both nostrils daily., Disp: 16 g, Rfl: 0 .  levonorgestrel-ethinyl estradiol (AVIANE) 0.1-20 MG-MCG tablet, Take 1 tablet by mouth daily., Disp: 84 tablet, Rfl: 0 .  levothyroxine (SYNTHROID, LEVOTHROID) 112 MCG tablet, 1 tablet daily in am and 1.5 tablets on Wednesdays, Disp: 96 tablet, Rfl: 3 .  phentermine (ADIPEX-P) 37.5 MG tablet, Take 1 tablet (37.5 mg total) by mouth daily before breakfast., Disp: 60 tablet, Rfl: 0  EXAM:  VITALS per patient if applicable:  GENERAL: alert, oriented, appears well and in no acute distress  HEENT: atraumatic, conjunttiva clear, no obvious abnormalities on inspection of external nose and ears  NECK: normal movements of the head and neck  LUNGS: on inspection no signs of  respiratory distress, breathing rate appears normal, no obvious gross SOB, gasping or wheezing  CV: no obvious cyanosis  MS: moves all visible extremities without noticeable abnormality  PSYCH/NEURO: pleasant and cooperative, no obvious depression or anxiety, speech and thought processing grossly intact  ASSESSMENT AND PLAN:  Discussed the following assessment and plan:  Hypothyroidism, unspecified type-cont same dose   Obesity (BMI 30-39.9) - Plan: phentermine (ADIPEX-P) 37.5 MG tablet x 2 months with RF x 1  Weight 212.6 lbs today  Anxiety and depression-cont meds  Iron deficiency-disc integra otc   HM Flu shot utd  Tdap in 2017  MMR  immune Had HPV vaccine  [facility]19 neg pap neg hpv disc irregular [facility]enses [facility] [address] [facility] resch to 05/2019    I discussed the assessment and treatment plan with the patient. The patient was provided an opportunity to ask questions and all were answered. The patient agreed with the plan and demonstrated an understanding of the instructions.   The patient was advised to call back or seek an in-person evaluation if the symptoms worsen or if the condition fails to improve as anticipated.  Time spent 15 minutes  Delorise Jackson, MD

## 2019-05-01 MED FILL — busPIRone HCL 10 MG TABS: 10 | 90 days supply | Qty: 180 | Fill #0

## 2019-05-01 MED FILL — buPROPion HCL ER (XL) 300 M: 300 | 90 days supply | Qty: 90 | Fill #0

## 2019-05-06 ENCOUNTER — Other Ambulatory Visit: Payer: Self-pay

## 2019-05-06 ENCOUNTER — Other Ambulatory Visit: Payer: Self-pay | Admitting: Obstetrics and Gynecology

## 2019-05-06 MED ORDER — LEVONORGESTREL-ETHINYL ESTRAD 0.1-20 MG-MCG PO TABS: 1.0000 | ORAL_TABLET | Freq: Every day | ORAL | 0 refills | Status: DC

## 2019-05-06 MED FILL — LARISSIA 0.1-20 MG-MCG TABS: 0.1-20 | 84 days supply | Qty: 84 | Fill #0

## 2019-05-26 ENCOUNTER — Other Ambulatory Visit: Payer: Self-pay | Admitting: Internal Medicine

## 2019-05-26 DIAGNOSIS — E669 Obesity, unspecified: Secondary | ICD-10-CM

## 2019-05-29 ENCOUNTER — Other Ambulatory Visit: Payer: Self-pay | Admitting: Internal Medicine

## 2019-05-29 ENCOUNTER — Encounter: Payer: Self-pay | Admitting: Internal Medicine

## 2019-05-29 DIAGNOSIS — E669 Obesity, unspecified: Secondary | ICD-10-CM

## 2019-05-29 MED ORDER — PHENTERMINE HCL 37.5 MG PO TABS: 37.5000 mg | ORAL_TABLET | Freq: Every day | ORAL | 0 refills | Status: DC

## 2019-06-09 NOTE — Progress Notes (Signed)
PCP:  McLean-Scocuzza, Nino Glow, MD   Chief Complaint  Patient presents with  . Gynecologic Exam     HPI:      [name] is a 33 y.o. [id number] who LMP was Patient's last menstrual period was 05/21/2019 (exact date)., presents today for her annual examination.  Her menses are still irreg since birth of last child (3 yrs ago). Pt did POP while breastfeeding and then changed to Lo Loestrin 3/19. Had BTB for many months, so changed to [place] 10/19. Has menses sometimes monthly on placebo pills, sometimes on active pills and some months not at all, lasting 5-7 days. Has frequent BTB without late/ missed pills. Pt is euthyroid on levo. Hx of endometrial polyp in past that resolved.   Sex activity: single partner, contraception - OCP (estrogen/progesterone).  Last Pap: 04/12/18  Results were: no abnormalities /neg HPV DNA Hx of STDs: none  There is no FH of breast cancer. There is no FH of ovarian cancer. The patient does do self-breast exams.  Tobacco use: The patient denies current or previous tobacco use. Alcohol use: social drinker No drug use.  Exercise: moderately active  She does get adequate calcium and Vitamin D in her diet.  Labs with PCP. Euthyroid 4/20.  Has anxiety/depression, doing pretty well with wellbutrin and buspar combination. Managed by PCP now.   Having issue with ext hemorrhoid since last pregnancy. Sometimes has small amt blood wit wiping after hard BM. Tries to keep stools soft but sometimes has constipation. Not bothersome otherwise.   Past Medical History:  Diagnosis Date  . Anxiety   . Depression   . Hypothyroidism   . Iron deficiency   . Obesity   . Thyroid disease     Past Surgical History:  Procedure Laterality Date  . CESAREAN SECTION  07/2008  . CESAREAN SECTION      Family History  Problem Relation Age of Onset  . Hyperlipidemia Mother   . Depression Mother   . Hypertension Mother   . Hypothyroidism Mother   . Thyroid  disease Mother   . Hypertension Father   . Diabetes Father   . Asthma Father   . Heart disease Father   . Hyperlipidemia Father   . Lupus Sister   . Hypothyroidism Sister   . Asthma Sister   . Miscarriages / Stillbirths Sister   . Depression Son   . Heart disease Maternal Grandmother   . Dementia Maternal Grandmother   . Thyroid disease Maternal Grandmother   . Thyroid disease Maternal Grandfather   . Colon cancer Maternal Grandfather 92  . Melanoma Maternal Grandfather 80  . Cancer Paternal Grandfather        skin   . Hashimoto's thyroiditis Sister        Thyroid nodules present, treated surgically  . Lupus Sister     Social History   Socioeconomic History  . Marital status: Married    Spouse name: Not on file  . Number of children: 1  . Years of education: Not on file  . Highest education level: Not on file  Occupational History  . Occupation: Occupational psychologist at [employer]: [place]  . Financial resource strain: Not on file  . Food insecurity    Worry: Not on file    Inability: Not on file  . Transportation needs    Medical: Not on file    Non-medical: Not on file  Tobacco Use  .  Smoking status: Former Games developermoker  . Smokeless tobacco: Never Used  . Tobacco comment: quit smoking 2009   Substance and Sexual Activity  . Alcohol use: Yes  . Drug use: No  . Sexual activity: Yes    Birth control/protection: Pill  Lifestyle  . Physical activity    Days per week: 3 days    Minutes per session: 30 min  . Stress: To some extent  Relationships  . Social connections    Talks on phone: More than three times a week    Gets together: Three times a week    Attends religious service: More than 4 times per year    Active member of club or organization: No    Attends meetings of clubs or organizations: Never    Relationship status: Married  . Intimate partner violence    Fear of current or ex partner: No    Emotionally abused: No    Physically  abused: No    Forced sexual activity: No  Other Topics Concern  . 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 buPROPion (WELLBUTRIN XL) 300 MG 24 hr tablet Take 1 tablet (300 mg total) by mouth every morning. 90 tablet 3  . busPIRone (BUSPAR) 10 MG tablet Take 1 tablet (10 mg total) by mouth 2 (two) times daily. 180 tablet 3  . fluticasone (FLONASE) 50 MCG/ACT nasal spray Place 2 sprays into both nostrils daily. 16 g 0  . levonorgestrel-ethinyl estradiol (AVIANE) 0.1-20 MG-MCG tablet Take 1 tablet by mouth daily. 84 tablet 0  . levothyroxine (SYNTHROID, LEVOTHROID) 112 MCG tablet 1 tablet daily in am and 1.5 tablets on Wednesdays 96 tablet 3  . phentermine (ADIPEX-P) 37.5 MG tablet Take 1 tablet (37.5 mg total) by mouth daily before breakfast. 60 tablet 0   No facility-administered medications prior to visit.     ROS:  Review of Systems  Constitutional: Negative for fatigue, fever and unexpected weight change.  Respiratory: Negative for cough, shortness of breath and wheezing.   Cardiovascular: Negative for chest pain, palpitations and leg swelling.  Gastrointestinal: Negative for blood in stool, constipation, diarrhea, nausea and vomiting.  Endocrine: Negative for cold intolerance, heat intolerance and polyuria.  Genitourinary: Negative for dyspareunia, dysuria, flank pain, frequency, genital sores, hematuria, menstrual problem, pelvic pain, urgency, vaginal bleeding, vaginal discharge and vaginal pain.  Musculoskeletal: Negative for back pain, joint swelling and myalgias.  Skin: Negative for rash.  Neurological: Negative for dizziness, syncope, light-headedness, numbness and headaches.  Hematological: Negative for adenopathy.  Psychiatric/Behavioral: Positive for agitation. Negative for  confusion, dysphoric mood, sleep disturbance and suicidal ideas. The patient is not nervous/anxious.    BREAST: No symptoms   Objective: BP 110/80   Ht 5\' 9"  (1.753 m)   Wt 207 lb 3.2 oz (94 kg)   LMP 05/21/2019 (Exact Date)   BMI 30.60 kg/m    Physical Exam Constitutional:      Appearance: She is well-developed.  Genitourinary:     Vulva, vagina, cervix, uterus, right adnexa and left adnexa normal.     No vulval lesion or tenderness noted.     No vaginal discharge, erythema or tenderness.     No cervical polyp.     Uterus  is not enlarged or tender.     No right or left adnexal mass present.     Right adnexa not tender.     Left adnexa not tender.  Rectum:     Guaiac result negative.     No anal fissure or external hemorrhoid.  Neck:     Musculoskeletal: Normal range of motion.     Thyroid: No thyromegaly.  Cardiovascular:     Rate and Rhythm: Normal rate and regular rhythm.     Heart sounds: Normal heart sounds. No murmur.  Pulmonary:     Effort: Pulmonary effort is normal.     Breath sounds: Normal breath sounds.  Chest:     Breasts:        Right: No mass, nipple discharge, skin change or tenderness.        Left: No mass, nipple discharge, skin change or tenderness.  Abdominal:     Palpations: Abdomen is soft.     Tenderness: There is no abdominal tenderness. There is no guarding.  Musculoskeletal: Normal range of motion.  Neurological:     General: No focal deficit present.     Mental Status: She is alert and oriented to person, place, and time.     Cranial Nerves: No cranial nerve deficit.  Skin:    General: Skin is warm and dry.  Psychiatric:        Mood and Affect: Mood normal.        Behavior: Behavior normal.        Thought Content: Thought content normal.        Judgment: Judgment normal.  Vitals signs reviewed.     Assessment/Plan: Encounter for annual routine gynecological examination -   Encounter for surveillance of contraceptive pills -  Plan: [facility] u/s.   Breakthrough bleeding on OCPs - Plan: US PELVIS TRANSVAGINAL NON-OB (TV ONLY), BTB on several OCPs. Check GYN u/s, hx of endometrial polyp in past. If neg, discussed nuvaring vs other OCP. Pt would prefer to try different pill. Will f/u with results.   Rectal bleeding - Plan: POCT Occult Blood Stool, Neg FOBT.  External hemorrhoid - Plan: POCT Occult Blood Stool, Keep stools soft, has colace prn. F/u prn.       GYN counsel adequate intake of calcium and vitamin D, diet and exercise     F/U  Return in about 1 year (around 06/10/2020) for GYN u/s for [facility]--[facility] to call pt.  Ferdie Bakken B. Dhiren Azimi, PA-C 06/12/2019 2:35 PM

## 2019-06-11 ENCOUNTER — Encounter: Payer: Self-pay | Admitting: Obstetrics and Gynecology

## 2019-06-11 ENCOUNTER — Ambulatory Visit (INDEPENDENT_AMBULATORY_CARE_PROVIDER_SITE_OTHER): Payer: No Typology Code available for payment source | Admitting: Obstetrics and Gynecology

## 2019-06-11 ENCOUNTER — Other Ambulatory Visit: Payer: Self-pay

## 2019-06-11 VITALS — BP 110/80 | Ht 69.0 in | Wt 207.2 lb

## 2019-06-11 DIAGNOSIS — K625 Hemorrhage of anus and rectum: Secondary | ICD-10-CM

## 2019-06-11 DIAGNOSIS — Z3041 Encounter for surveillance of contraceptive pills: Secondary | ICD-10-CM

## 2019-06-11 DIAGNOSIS — N921 Excessive and frequent menstruation with irregular cycle: Secondary | ICD-10-CM

## 2019-06-11 DIAGNOSIS — K644 Residual hemorrhoidal skin tags: Secondary | ICD-10-CM | POA: Insufficient documentation

## 2019-06-11 DIAGNOSIS — Z01419 Encounter for gynecological examination (general) (routine) without abnormal findings: Secondary | ICD-10-CM | POA: Diagnosis not present

## 2019-06-11 HISTORY — DX: Residual hemorrhoidal skin tags: K64.4

## 2019-06-11 NOTE — Patient Instructions (Signed)
I value your feedback and entrusting us with your care. If you get a [facility] patient survey, I would appreciate you taking the time to let us know about your experience today. Thank you! 

## 2019-06-12 ENCOUNTER — Other Ambulatory Visit: Payer: Self-pay | Admitting: Internal Medicine

## 2019-06-12 ENCOUNTER — Encounter: Payer: Self-pay | Admitting: Internal Medicine

## 2019-06-12 DIAGNOSIS — F419 Anxiety disorder, unspecified: Secondary | ICD-10-CM

## 2019-06-12 LAB — HEMOCCULT GUIAC POC 1CARD (OFFICE): Fecal Occult Blood, POC: NEGATIVE

## 2019-06-12 MED ORDER — LORAZEPAM 0.5 MG PO TABS: 0.5000 mg | ORAL_TABLET | Freq: Every day | ORAL | 0 refills | Status: DC | PRN

## 2019-07-02 ENCOUNTER — Other Ambulatory Visit: Payer: Self-pay | Admitting: Obstetrics and Gynecology

## 2019-07-03 ENCOUNTER — Other Ambulatory Visit: Payer: Self-pay

## 2019-07-03 DIAGNOSIS — F3289 Other specified depressive episodes: Secondary | ICD-10-CM

## 2019-07-03 DIAGNOSIS — E039 Hypothyroidism, unspecified: Secondary | ICD-10-CM

## 2019-07-03 DIAGNOSIS — F419 Anxiety disorder, unspecified: Secondary | ICD-10-CM

## 2019-07-03 MED ORDER — BUSPIRONE HCL 10 MG PO TABS: 10.0000 mg | ORAL_TABLET | Freq: Two times a day (BID) | ORAL | 3 refills | Status: DC

## 2019-07-03 MED ORDER — BUPROPION HCL ER (XL) 300 MG PO TB24: 300.0000 mg | ORAL_TABLET | Freq: Every morning | ORAL | 3 refills | Status: DC

## 2019-07-03 MED ORDER — LEVOTHYROXINE SODIUM 112 MCG PO TABS: ORAL_TABLET | ORAL | 3 refills | Status: DC

## 2019-09-06 ENCOUNTER — Other Ambulatory Visit: Payer: Self-pay

## 2019-09-06 ENCOUNTER — Ambulatory Visit (INDEPENDENT_AMBULATORY_CARE_PROVIDER_SITE_OTHER): Payer: No Typology Code available for payment source

## 2019-09-06 DIAGNOSIS — N921 Excessive and frequent menstruation with irregular cycle: Secondary | ICD-10-CM

## 2019-09-09 ENCOUNTER — Telehealth: Payer: Self-pay | Admitting: Obstetrics and Gynecology

## 2019-09-09 MED ORDER — DROSPIRENONE-ETHINYL ESTRADIOL 3-0.02 MG PO TABS: 1.0000 | ORAL_TABLET | Freq: Every day | ORAL | 2 refills | Status: DC

## 2019-09-09 NOTE — Telephone Encounter (Signed)
Spoke with pt re: normal GYN u/s for BTB with several OCPs. Currently on aviane, will change to yaz. Also discussed nuvaring but pt prefers OCPs. Rx eRxd. F/u prn.

## 2019-10-08 ENCOUNTER — Ambulatory Visit: Payer: No Typology Code available for payment source | Admitting: Internal Medicine

## 2019-10-08 ENCOUNTER — Other Ambulatory Visit: Payer: Self-pay

## 2019-10-10 ENCOUNTER — Encounter: Payer: Self-pay | Admitting: Internal Medicine

## 2019-10-10 ENCOUNTER — Ambulatory Visit (INDEPENDENT_AMBULATORY_CARE_PROVIDER_SITE_OTHER): Payer: No Typology Code available for payment source | Admitting: Internal Medicine

## 2019-10-10 ENCOUNTER — Other Ambulatory Visit: Payer: Self-pay

## 2019-10-10 VITALS — BP 112/66 | HR 83 | Temp 97.8°F | Ht 69.0 in | Wt 204.8 lb

## 2019-10-10 DIAGNOSIS — E669 Obesity, unspecified: Secondary | ICD-10-CM

## 2019-10-10 DIAGNOSIS — F32A Depression, unspecified: Secondary | ICD-10-CM

## 2019-10-10 DIAGNOSIS — F419 Anxiety disorder, unspecified: Secondary | ICD-10-CM | POA: Diagnosis not present

## 2019-10-10 DIAGNOSIS — E611 Iron deficiency: Secondary | ICD-10-CM

## 2019-10-10 DIAGNOSIS — E039 Hypothyroidism, unspecified: Secondary | ICD-10-CM | POA: Diagnosis not present

## 2019-10-10 DIAGNOSIS — E785 Hyperlipidemia, unspecified: Secondary | ICD-10-CM

## 2019-10-10 DIAGNOSIS — F329 Major depressive disorder, single episode, unspecified: Secondary | ICD-10-CM

## 2019-10-10 LAB — COMPREHENSIVE METABOLIC PANEL
ALT: 17 U/L (ref 0–35)
AST: 16 U/L (ref 0–37)
Albumin: 4.7 g/dL (ref 3.5–5.2)
Alkaline Phosphatase: 83 U/L (ref 39–117)
BUN: 13 mg/dL (ref 6–23)
CO2: 28 mEq/L (ref 19–32)
Calcium: 9.7 mg/dL (ref 8.4–10.5)
Chloride: 102 mEq/L (ref 96–112)
Creatinine, Ser: 0.84 mg/dL (ref 0.40–1.20)
GFR: 78.26 mL/min (ref >60.00)
Glucose, Bld: 74 mg/dL (ref 70–99)
Potassium: 4.3 mEq/L (ref 3.5–5.1)
Sodium: 137 mEq/L (ref 135–145)
Total Bilirubin: 0.4 mg/dL (ref 0.2–1.2)
Total Protein: 7.5 g/dL (ref 6.0–8.3)

## 2019-10-10 LAB — T4, FREE: Free T4: 1.1 ng/dL (ref 0.60–1.60)

## 2019-10-10 LAB — TSH: TSH: 0.94 u[IU]/mL (ref 0.35–4.50)

## 2019-10-10 MED ORDER — PHENTERMINE HCL 37.5 MG PO TABS: 37.5000 mg | ORAL_TABLET | Freq: Every day | ORAL | 0 refills | Status: DC

## 2019-10-10 NOTE — Patient Instructions (Signed)
Healthy Eating Following a healthy eating pattern may help you to achieve and maintain a healthy body weight, reduce the risk of chronic disease, and live a long and productive life. It is important to follow a healthy eating pattern at an appropriate calorie level for your body. Your nutritional needs should be met primarily through food by choosing a variety of nutrient-rich foods. What are tips for following this plan? Reading food labels  Read labels and choose the following: ? Reduced or low sodium. ? Juices with 100% fruit juice. ? Foods with low saturated fats and high polyunsaturated and monounsaturated fats. ? Foods with whole grains, such as whole wheat, cracked wheat, brown rice, and wild rice. ? Whole grains that are fortified with folic acid. This is recommended for women who are pregnant or who want to become pregnant.  Read labels and avoid the following: ? Foods with a lot of added sugars. These include foods that contain brown sugar, corn sweetener, corn syrup, dextrose, fructose, glucose, high-fructose corn syrup, honey, invert sugar, lactose, malt syrup, maltose, molasses, raw sugar, sucrose, trehalose, or turbinado sugar.  Do not eat more than the following amounts of added sugar per day:  6 teaspoons (25 g) for women.  9 teaspoons (38 g) for men. ? Foods that contain processed or refined starches and grains. ? Refined grain products, such as white flour, degermed cornmeal, white bread, and white rice. Shopping  Choose nutrient-rich snacks, such as vegetables, whole fruits, and nuts. Avoid high-calorie and high-sugar snacks, such as potato chips, fruit snacks, and candy.  Use oil-based dressings and spreads on foods instead of solid fats such as butter, stick margarine, or cream cheese.  Limit pre-made sauces, mixes, and "instant" products such as flavored rice, instant noodles, and ready-made pasta.  Try more plant-protein sources, such as tofu, tempeh, black beans,  edamame, lentils, nuts, and seeds.  Explore eating plans such as the Mediterranean diet or vegetarian diet. Cooking  Use oil to saut or stir-fry foods instead of solid fats such as butter, stick margarine, or lard.  Try baking, boiling, grilling, or broiling instead of frying.  Remove the fatty part of meats before cooking.  Steam vegetables in water or broth. Meal planning   At meals, imagine dividing your plate into fourths: ? One-half of your plate is fruits and vegetables. ? One-fourth of your plate is whole grains. ? One-fourth of your plate is protein, especially lean meats, poultry, eggs, tofu, beans, or nuts.  Include low-fat dairy as part of your daily diet. Lifestyle  Choose healthy options in all settings, including home, work, school, restaurants, or stores.  Prepare your food safely: ? Wash your hands after handling raw meats. ? Keep food preparation surfaces clean by regularly washing with hot, soapy water. ? Keep raw meats separate from ready-to-eat foods, such as fruits and vegetables. ? Cook seafood, meat, poultry, and eggs to the recommended internal temperature. ? Store foods at safe temperatures. In general:  Keep cold foods at 59F (4.4C) or below.  Keep hot foods at 159F (60C) or above.  Keep your freezer at [facility] (-17.8C) or below.  Foods are no longer safe to eat when they have been between the temperatures of 40-159F (4.4-60C) for more than 2 hours. What foods should I eat? Fruits Aim to eat 2 cup-equivalents of fresh, canned (in natural juice), or frozen fruits each day. Examples of 1 cup-equivalent of fruit include 1 small apple, 8 large strawberries, 1 cup canned fruit,  cup  dried fruit, or 1 cup 100% juice. Vegetables Aim to eat 2-3 cup-equivalents of fresh and frozen vegetables each day, including different varieties and colors. Examples of 1 cup-equivalent of vegetables include 2 medium carrots, 2 cups raw, leafy greens, 1 cup chopped  vegetable (raw or cooked), or 1 medium baked potato. Grains Aim to eat 6 ounce-equivalents of whole grains each day. Examples of 1 ounce-equivalent of grains include 1 slice of bread, 1 cup ready-to-eat cereal, 3 cups popcorn, or  cup cooked rice, pasta, or cereal. Meats and other proteins Aim to eat 5-6 ounce-equivalents of protein each day. Examples of 1 ounce-equivalent of protein include 1 egg, 1/2 cup nuts or seeds, or 1 tablespoon (16 g) peanut butter. A cut of meat or fish that is the size of a deck of cards is about 3-4 ounce-equivalents.  Of the protein you eat each week, try to have at least 8 ounces come from seafood. This includes salmon, trout, herring, and anchovies. Dairy Aim to eat 3 cup-equivalents of fat-free or low-fat dairy each day. Examples of 1 cup-equivalent of dairy include 1 cup (240 mL) milk, 8 ounces (250 g) yogurt, 1 ounces (44 g) natural cheese, or 1 cup (240 mL) fortified soy milk. Fats and oils  Aim for about 5 teaspoons (21 g) per day. Choose monounsaturated fats, such as canola and olive oils, avocados, peanut butter, and most nuts, or polyunsaturated fats, such as sunflower, corn, and soybean oils, walnuts, pine nuts, sesame seeds, sunflower seeds, and flaxseed. Beverages  Aim for six 8-oz glasses of water per day. Limit coffee to three to five 8-oz cups per day.  Limit caffeinated beverages that have added calories, such as soda and energy drinks.  Limit alcohol intake to no more than 1 drink a day for nonpregnant women and 2 drinks a day for men. One drink equals 12 oz of beer (355 mL), 5 oz of wine (148 mL), or 1 oz of hard liquor (44 mL). Seasoning and other foods  Avoid adding excess amounts of salt to your foods. Try flavoring foods with herbs and spices instead of salt.  Avoid adding sugar to foods.  Try using oil-based dressings, sauces, and spreads instead of solid fats. This information is based on general [place] nutrition guidelines. For more  information, visit choosemyplate.gov. Exact amounts may vary based on your nutrition needs. Summary  A healthy eating plan may help you to maintain a healthy weight, reduce the risk of chronic diseases, and stay active throughout your life.  Plan your meals. Make sure you eat the right portions of a variety of nutrient-rich foods.  Try baking, boiling, grilling, or broiling instead of frying.  Choose healthy options in all settings, including home, work, school, restaurants, or stores. This information is not intended to replace advice given to you by your health care provider. Make sure you discuss any questions you have with your health care provider. Document Released: 02/26/2018 Document Revised: 02/26/2018 Document Reviewed: 02/26/2018 Elsevier Patient Education  2020 [employer]  

## 2019-10-10 NOTE — Progress Notes (Signed)
Chief Complaint  Patient presents with  . Follow-up   F/u doing well  1. Hypothyroidism on levo 112 as directed c/o reduce energy x 2 weeks not sure if due to thyroid of work/life mild stressors  2. Obesity wants refill of adipex did get down to 199 but weight back up exercising less due to change in time  3. Irregular spotting mid cycle slightly improved on yaz and she had a light cycle last week f/u ob/gyn  4. Anxiety controlled on buspar 10 bid, wellbutrin xl 300 mg qd and prn ativan not really taking this  5. HLD will get copy of lipid repeat had in 07/2019  6. Iron def she is taking iron in [facility]   Review of Systems  Constitutional: Positive for malaise/fatigue. Negative for weight loss.  HENT: Negative for hearing loss.   Eyes: Negative for blurred vision.  Respiratory: Negative for shortness of breath.   Cardiovascular: Negative for chest pain.  Gastrointestinal: Negative for abdominal pain.  Musculoskeletal: Negative for falls.  Skin: Negative for rash.  Neurological: Negative for headaches.  Psychiatric/Behavioral: Negative for depression. The patient is nervous/anxious.    Past Medical History:  Diagnosis Date  . Anxiety   . Depression   . Hypothyroidism   . Iron deficiency   . Obesity   . Thyroid disease    Past Surgical History:  Procedure Laterality Date  . CESAREAN SECTION  07/2008  . CESAREAN SECTION     Family History  Problem Relation Age of Onset  . Hyperlipidemia Mother   . Depression Mother   . Hypertension Mother   . Hypothyroidism Mother   . Thyroid disease Mother   . Hypertension Father   . Diabetes Father   . Asthma Father   . Heart disease Father   . Hyperlipidemia Father   . Lupus Sister   . Hypothyroidism Sister   . Asthma Sister   . Miscarriages / Stillbirths Sister   . Depression Son   . Heart disease Maternal Grandmother   . Dementia Maternal Grandmother   . Thyroid disease Maternal Grandmother   . Thyroid disease Maternal  Grandfather   . Colon cancer Maternal Grandfather 28  . Melanoma Maternal Grandfather 80  . Cancer Paternal Grandfather        skin   . Hashimoto's thyroiditis Sister        Thyroid nodules present, treated surgically  . Lupus Sister    Social History   Socioeconomic History  . Marital status: Married    Spouse name: Not on file  . Number of children: 1  . Years of education: Not on file  . Highest education level: Not on file  Occupational History  . Occupation: Occupational psychologist at [employer]: [place]  . Financial resource strain: Not on file  . Food insecurity    Worry: Not on file    Inability: Not on file  . Transportation needs    Medical: Not on file    Non-medical: Not on file  Tobacco Use  . Smoking status: Former Research scientist (life sciences)  . Smokeless tobacco: Never Used  . Tobacco comment: quit smoking 2009   Substance and Sexual Activity  . Alcohol use: Yes  . Drug use: No  . Sexual activity: Yes    Birth control/protection: Pill  Lifestyle  . Physical activity    Days per week: 3 days    Minutes per session: 30 min  . Stress: To some extent  Relationships  . Social connections    Talks on phone: More than three times a week    Gets together: Three times a week    Attends religious service: More than 4 times per year    Active member of club or organization: No    Attends meetings of clubs or organizations: Never    Relationship status: Married  . Intimate partner violence    Fear of current or ex partner: No    Emotionally abused: No    Physically abused: No    Forced sexual activity: No  Other Topics Concern  . 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 buPROPion (WELLBUTRIN XL) 300 MG 24 hr tablet Take 1 tablet (300 mg total) by mouth every morning.  . busPIRone (BUSPAR) 10 MG tablet Take 1  tablet (10 mg total) by mouth 2 (two) times daily.  . drospirenone-ethinyl estradiol (YAZ) 3-0.02 MG tablet Take 1 tablet by mouth daily.  . fluticasone (FLONASE) 50 MCG/ACT nasal spray Place 2 sprays into both nostrils daily.  Marland Kitchen levothyroxine (SYNTHROID) 112 MCG tablet 1 tablet daily in am and 1.5 tablets on Wednesdays  . LORazepam (ATIVAN) 0.5 MG tablet Take 1-2 tablets (0.5-1 mg total) by mouth daily as needed for anxiety.  . phentermine (ADIPEX-P) 37.5 MG tablet Take 1 tablet (37.5 mg total) by mouth daily before breakfast.  . [DISCONTINUED] phentermine (ADIPEX-P) 37.5 MG tablet Take 1 tablet (37.5 mg total) by mouth daily before breakfast.   No Known Allergies No results found for this or any previous visit (from the past 2160 hour(s)). Objective  Body mass index is 30.24 kg/m. Wt Readings from Last 3 Encounters:  10/10/19 204 lb 12.8 oz (92.9 kg)  06/11/19 207 lb 3.2 oz (94 kg)  12/25/18 215 lb (97.5 kg)   Temp Readings from Last 3 Encounters:  10/10/19 97.8 F (36.6 C) (Skin)  12/25/18 98.1 F (36.7 C)  10/01/18 98.4 F (36.9 C)   BP Readings from Last 3 Encounters:  10/10/19 112/66  06/11/19 110/80  12/25/18 126/64   Pulse Readings from Last 3 Encounters:  10/10/19 83  12/25/18 94  10/01/18 78    Physical Exam Vitals signs and nursing note reviewed.  Constitutional:      Appearance: Normal appearance. She is well-developed and well-groomed. She is obese.     Comments: +mask on    HENT:     Head: Normocephalic and atraumatic.  Eyes:     Conjunctiva/sclera: Conjunctivae normal.     Pupils: Pupils are equal, round, and reactive to light.  Cardiovascular:     Rate and Rhythm: Normal rate and regular rhythm.     Heart sounds: Normal heart sounds. No murmur.  Pulmonary:     Effort: Pulmonary effort is normal.     Breath sounds: Normal breath sounds.  Abdominal:     General: Abdomen is flat. Bowel sounds are normal.     Tenderness: There is no abdominal  tenderness.  Skin:    General: Skin is warm and dry.  Neurological:     General: No focal deficit present.     Mental Status: She is alert and oriented to person, place, and time. Mental status is at baseline.     Gait: Gait  normal.  Psychiatric:        Attention and Perception: Attention and perception normal.        Mood and Affect: Mood and affect normal.        Speech: Speech normal.        Behavior: Behavior normal. Behavior is cooperative.        Thought Content: Thought content normal.        Cognition and Memory: Cognition and memory normal.        Judgment: Judgment normal.     Assessment  Plan  Hypothyroidism, unspecified type - Plan: Comprehensive metabolic panel, TSH, T4, free, Thyroid peroxidase antibody Cont levo 112   Obesity (BMI 30-39.9) - Plan: phentermine (ADIPEX-P) 37.5 MG tablet, phentermine (ADIPEX-P) 37.5 MG tablet x 4 month supply then 3 months off  Healthy diet and exercise   Iron deficiency - Plan: Iron, TIBC and Ferritin Panel  Hyperlipidemia, unspecified hyperlipidemia type My chart labs from 07/2019 improved slightly per pt   Anxiety and depression  -cont meds   HM Flu shotutd Tdap in 2017  MMR immune Had HPV vaccine  [facility] neg pap neg hpv disc irregular menses with [facility] [address] 05/2019 normal f/u 05/2020   Provider: Dr. Olivia Mackie McLean-Scocuzza-Internal Medicine

## 2019-10-11 LAB — IRON,TIBC AND FERRITIN PANEL
%SAT: 30 % (calc) (ref 16–45)
Ferritin: 23 ng/mL (ref 16–154)
Iron: 132 ug/dL (ref 40–190)
TIBC: 446 mcg/dL (calc) (ref 250–450)

## 2019-10-11 LAB — THYROID PEROXIDASE ANTIBODY: Thyroperoxidase Ab SerPl-aCnc: 132 IU/mL — ABNORMAL HIGH (ref <9)

## 2019-11-12 ENCOUNTER — Encounter: Payer: Self-pay | Admitting: Internal Medicine

## 2019-12-06 ENCOUNTER — Encounter: Payer: Self-pay | Admitting: Internal Medicine

## 2019-12-11 ENCOUNTER — Other Ambulatory Visit: Payer: Self-pay | Admitting: Internal Medicine

## 2019-12-11 DIAGNOSIS — M25519 Pain in unspecified shoulder: Secondary | ICD-10-CM

## 2019-12-11 DIAGNOSIS — M542 Cervicalgia: Secondary | ICD-10-CM

## 2019-12-11 DIAGNOSIS — G8929 Other chronic pain: Secondary | ICD-10-CM

## 2019-12-11 DIAGNOSIS — R519 Headache, unspecified: Secondary | ICD-10-CM

## 2020-03-17 ENCOUNTER — Telehealth: Payer: Self-pay | Admitting: Internal Medicine

## 2020-03-17 NOTE — Telephone Encounter (Signed)
adipex is 4 months on and 3 months off  Not due for refill until 05/09/20   TMS

## 2020-03-18 NOTE — Telephone Encounter (Signed)
Patient informed and verbalized understanding

## 2020-04-09 ENCOUNTER — Encounter: Payer: Self-pay | Admitting: Internal Medicine

## 2020-04-09 ENCOUNTER — Ambulatory Visit (INDEPENDENT_AMBULATORY_CARE_PROVIDER_SITE_OTHER): Payer: No Typology Code available for payment source | Admitting: Internal Medicine

## 2020-04-09 ENCOUNTER — Other Ambulatory Visit: Payer: Self-pay

## 2020-04-09 VITALS — BP 128/78 | HR 81 | Temp 97.2°F | Ht 69.0 in | Wt 216.8 lb

## 2020-04-09 DIAGNOSIS — E039 Hypothyroidism, unspecified: Secondary | ICD-10-CM

## 2020-04-09 DIAGNOSIS — IMO0002 Reserved for concepts with insufficient information to code with codable children: Secondary | ICD-10-CM

## 2020-04-09 DIAGNOSIS — F329 Major depressive disorder, single episode, unspecified: Secondary | ICD-10-CM

## 2020-04-09 DIAGNOSIS — N3000 Acute cystitis without hematuria: Secondary | ICD-10-CM

## 2020-04-09 DIAGNOSIS — Z840 Family history of diseases of the skin and subcutaneous tissue: Secondary | ICD-10-CM

## 2020-04-09 DIAGNOSIS — Z0000 Encounter for general adult medical examination without abnormal findings: Secondary | ICD-10-CM

## 2020-04-09 DIAGNOSIS — R21 Rash and other nonspecific skin eruption: Secondary | ICD-10-CM

## 2020-04-09 DIAGNOSIS — E669 Obesity, unspecified: Secondary | ICD-10-CM

## 2020-04-09 DIAGNOSIS — F419 Anxiety disorder, unspecified: Secondary | ICD-10-CM

## 2020-04-09 DIAGNOSIS — M2550 Pain in unspecified joint: Secondary | ICD-10-CM

## 2020-04-09 DIAGNOSIS — Z13220 Encounter for screening for lipoid disorders: Secondary | ICD-10-CM

## 2020-04-09 DIAGNOSIS — F32A Depression, unspecified: Secondary | ICD-10-CM

## 2020-04-09 HISTORY — DX: Encounter for general adult medical examination without abnormal findings: Z00.00

## 2020-04-09 LAB — CBC WITH DIFFERENTIAL/PLATELET
Basophils Absolute: 0.1 10*3/uL (ref 0.0–0.1)
Basophils Relative: 1.5 % (ref 0.0–3.0)
Eosinophils Absolute: 0.1 10*3/uL (ref 0.0–0.7)
Eosinophils Relative: 1.6 % (ref 0.0–5.0)
HCT: 39 % (ref 36.0–46.0)
Hemoglobin: 12.9 g/dL (ref 12.0–15.0)
Lymphocytes Relative: 36 % (ref 12.0–46.0)
Lymphs Abs: 2 10*3/uL (ref 0.7–4.0)
MCHC: 33.1 g/dL (ref 30.0–36.0)
MCV: 87.2 fl (ref 78.0–100.0)
Monocytes Absolute: 0.4 10*3/uL (ref 0.1–1.0)
Monocytes Relative: 6.9 % (ref 3.0–12.0)
Neutro Abs: 3 10*3/uL (ref 1.4–7.7)
Neutrophils Relative %: 54 % (ref 43.0–77.0)
Platelets: 287 10*3/uL (ref 150.0–400.0)
RBC: 4.47 Mil/uL (ref 3.87–5.11)
RDW: 12.7 % (ref 11.5–15.5)
WBC: 5.6 10*3/uL (ref 4.0–10.5)

## 2020-04-09 LAB — LIPID PANEL
Cholesterol: 252 mg/dL — ABNORMAL HIGH (ref 0–200)
HDL: 69.4 mg/dL (ref >39.00)
LDL Cholesterol: 156 mg/dL — ABNORMAL HIGH (ref 0–99)
NonHDL: 182.96
Total CHOL/HDL Ratio: 4
Triglycerides: 135 mg/dL (ref 0.0–149.0)
VLDL: 27 mg/dL (ref 0.0–40.0)

## 2020-04-09 LAB — COMPREHENSIVE METABOLIC PANEL
ALT: 19 U/L (ref 0–35)
AST: 16 U/L (ref 0–37)
Albumin: 4.3 g/dL (ref 3.5–5.2)
Alkaline Phosphatase: 71 U/L (ref 39–117)
BUN: 15 mg/dL (ref 6–23)
CO2: 27 mEq/L (ref 19–32)
Calcium: 9.1 mg/dL (ref 8.4–10.5)
Chloride: 103 mEq/L (ref 96–112)
Creatinine, Ser: 0.82 mg/dL (ref 0.40–1.20)
GFR: 80.22 mL/min (ref >60.00)
Glucose, Bld: 89 mg/dL (ref 70–99)
Potassium: 3.9 mEq/L (ref 3.5–5.1)
Sodium: 136 mEq/L (ref 135–145)
Total Bilirubin: 0.4 mg/dL (ref 0.2–1.2)
Total Protein: 7 g/dL (ref 6.0–8.3)

## 2020-04-09 LAB — SEDIMENTATION RATE: Sed Rate: 13 mm/hr (ref 0–20)

## 2020-04-09 LAB — TSH: TSH: 3.92 u[IU]/mL (ref 0.35–4.50)

## 2020-04-09 LAB — C-REACTIVE PROTEIN: CRP: <1 mg/dL (ref 0.5–20.0)

## 2020-04-09 LAB — T4, FREE: Free T4: 0.89 ng/dL (ref 0.60–1.60)

## 2020-04-09 MED ORDER — PHENTERMINE HCL 37.5 MG PO TABS: 37.5000 mg | ORAL_TABLET | Freq: Every day | ORAL | 0 refills | Status: DC

## 2020-04-09 NOTE — Progress Notes (Signed)
Chief Complaint  Patient presents with  . Follow-up   Annual  1. Hypothyroidism on synthyroid 112 mcg 1 tab qd and 1.5 on Weds 2. Obesity wants refill of adipex  3. C/o anxiety/depression given list of therapist phq 9 score 8 and GAD 7 score 13 wellbutrin 300 buspar 10 bid, prn ativan  4. FH sister lupus and c/o low back pain, joint pain, malar rash and wants to be tested as well lllr/o lupus   Review of Systems  Constitutional: Negative for weight loss.  HENT: Negative for hearing loss.   Eyes: Negative for blurred vision.  Respiratory: Negative for shortness of breath.   Cardiovascular: Negative for chest pain.  Gastrointestinal: Negative for abdominal pain.  Genitourinary: Negative for urgency.  Musculoskeletal: Positive for joint pain.  Skin: Positive for rash.  Psychiatric/Behavioral: Positive for depression. The patient is nervous/anxious.    Past Medical History:  Diagnosis Date  . Anxiety   . Depression   . Hypothyroidism   . Iron deficiency   . Obesity   . Thyroid disease    Past Surgical History:  Procedure Laterality Date  . CESAREAN SECTION  07/2008  . CESAREAN SECTION     Family History  Problem Relation Age of Onset  . Hyperlipidemia Mother   . Depression Mother   . Hypertension Mother   . Hypothyroidism Mother   . Thyroid disease Mother   . Hypertension Father   . Diabetes Father   . Asthma Father   . Heart disease Father   . Hyperlipidemia Father   . Lupus Sister   . Hypothyroidism Sister   . Asthma Sister   . Miscarriages / Stillbirths Sister   . Depression Son   . Heart disease Maternal Grandmother   . Dementia Maternal Grandmother   . Thyroid disease Maternal Grandmother   . Thyroid disease Maternal Grandfather   . Colon cancer Maternal Grandfather 15  . Melanoma Maternal Grandfather 80  . Cancer Paternal Grandfather        skin   . Hashimoto's thyroiditis Sister        Thyroid nodules present, treated surgically  . Lupus Sister     Social History   Socioeconomic History  . Marital status: Married    Spouse name: Not on file  . Number of children: 1  . Years of education: Not on file  . Highest education level: Not on file  Occupational History  . Occupation: Occupational psychologist at [employer]: [place]  Tobacco Use  . Smoking status: Former Research scientist (life sciences)  . Smokeless tobacco: Never Used  . Tobacco comment: quit smoking 2009   Substance and Sexual Activity  . Alcohol use: Yes  . Drug use: No  . Sexual activity: Yes    Birth control/protection: Pill  Other Topics Concern  . 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 Difficulty of Paying Living Expenses:   Food Insecurity:   . Worried About Charity fundraiser in the Last Year:   . Arboriculturist in the Last Year:   Transportation Needs:   . Film/video editor (Medical):   Marland Kitchen Lack of Transportation (Non-Medical):   Physical Activity:   . Days of Exercise  per Week:   . Minutes of Exercise per Session:   Stress:   . Feeling of Stress :   Social Connections:   . Frequency of Communication with Friends and Family:   . Frequency of Social Gatherings with Friends and Family:   . Attends Religious Services:   . Active Member of Clubs or Organizations:   . Attends Archivist Meetings:   Marland Kitchen Marital Status:   Intimate Partner Violence:   . Fear of Current or Ex-Partner:   . Emotionally Abused:   Marland Kitchen Physically Abused:   . Sexually Abused:    Current Meds  Medication Sig  . buPROPion (WELLBUTRIN XL) 300 MG 24 hr tablet Take 1 tablet (300 mg total) by mouth every morning.  . busPIRone (BUSPAR) 10 MG tablet Take 1 tablet (10 mg total) by mouth 2 (two) times daily.  . drospirenone-ethinyl estradiol (YAZ) 3-0.02 MG tablet Take 1 tablet by mouth daily.  .  Levocetirizine Dihydrochloride (XYZAL PO) Take by mouth.  . levothyroxine (SYNTHROID) 112 MCG tablet 1 tablet daily in am and 1.5 tablets on Wednesdays   No Known Allergies No results found for this or any previous visit (from the past 2160 hour(s)). Objective  Body mass index is 32.02 kg/m. Wt Readings from Last 3 Encounters:  04/09/20 216 lb 12.8 oz (98.3 kg)  10/10/19 204 lb 12.8 oz (92.9 kg)  06/11/19 207 lb 3.2 oz (94 kg)   Temp Readings from Last 3 Encounters:  04/09/20 (!) 97.2 F (36.2 C) (Temporal)  10/10/19 97.8 F (36.6 C) (Skin)  12/25/18 98.1 F (36.7 C)   BP Readings from Last 3 Encounters:  04/09/20 128/78  10/10/19 112/66  06/11/19 110/80   Pulse Readings from Last 3 Encounters:  04/09/20 81  10/10/19 83  12/25/18 94    Physical Exam Vitals and nursing note reviewed.  Constitutional:      Appearance: Normal appearance. She is well-developed and well-groomed. She is obese.  HENT:     Head: Normocephalic and atraumatic.  Eyes:     Conjunctiva/sclera: Conjunctivae normal.     Pupils: Pupils are equal, round, and reactive to light.  Cardiovascular:     Rate and Rhythm: Normal rate and regular rhythm.     Heart sounds: Normal heart sounds. No murmur.  Pulmonary:     Effort: Pulmonary effort is normal.     Breath sounds: Normal breath sounds.  Abdominal:     General: Abdomen is flat. Bowel sounds are normal.  Skin:    General: Skin is warm and dry.  Neurological:     General: No focal deficit present.     Mental Status: She is alert and oriented to person, place, and time. Mental status is at baseline.     Gait: Gait normal.  Psychiatric:        Attention and Perception: Attention and perception normal.        Mood and Affect: Mood and affect normal.        Speech: Speech normal.        Behavior: Behavior is cooperative.        Thought Content: Thought content normal.        Cognition and Memory: Cognition and memory normal.        Judgment:  Judgment normal.     Assessment  Plan  Annual physical exam - Flu shotutd Tdap in 2017 MMR immune Had HPV vaccine  [facility]19 neg pap neg hpv disc irregular menses with [place]  [facility] [address] 7/2020normal f/u 05/2020  rec healthy diet and exercise   Hypothyroidism, unspecified type - Plan: TSH, T4, free, Thyroid peroxidase antibody Cont meds   Polyarthralgia - Plan: Antinuclear Antib (ANA), Sedimentation rate, C-reactive protein, Rheumatoid Factor, Cyclic citrul peptide antibody, IgG (QUEST)  FH: lupus - Plan: Antinuclear Antib (ANA), Sedimentation rate, C-reactive protein, Rheumatoid Factor, Cyclic citrul peptide antibody, IgG (QUEST)  Malar rash - Plan: Antinuclear Antib (ANA), Sedimentation rate, C-reactive protein, Rheumatoid Factor, Cyclic citrul peptide antibody, IgG (QUEST)  Obesity (BMI 30-39.9) - Plan: phentermine (ADIPEX-P) 37.5 MG tablet, phentermine (ADIPEX-P) 37.5 MG tablet 4 months on and 2 months off  Anxiety and depression  Given list of therapist    Provider: Dr. Olivia Mackie McLean-Scocuzza-Internal Medicine

## 2020-04-09 NOTE — Patient Instructions (Addendum)
[facility] consider couples   COVID-19 Vaccine Information can be found at: [email or web address] For questions related to vaccine distribution or appointments, please email [email or web address] or call [phone].  [employer]

## 2020-04-10 LAB — URINALYSIS, ROUTINE W REFLEX MICROSCOPIC
Bacteria, UA: NONE SEEN /HPF
Bilirubin Urine: NEGATIVE
Glucose, UA: NEGATIVE
Hgb urine dipstick: NEGATIVE
Hyaline Cast: NONE SEEN /LPF
Ketones, ur: NEGATIVE
Nitrite: NEGATIVE
Protein, ur: NEGATIVE
RBC / HPF: NONE SEEN /HPF (ref 0–2)
Specific Gravity, Urine: 1.014 (ref 1.001–1.03)
pH: 7 (ref 5.0–8.0)

## 2020-04-10 LAB — URINE CULTURE
MICRO NUMBER:: 10474142
Result:: NO GROWTH
SPECIMEN QUALITY:: ADEQUATE

## 2020-04-10 LAB — THYROID PEROXIDASE ANTIBODY: Thyroperoxidase Ab SerPl-aCnc: 163 IU/mL — ABNORMAL HIGH (ref <9)

## 2020-04-11 LAB — RHEUMATOID FACTOR: Rheumatoid fact SerPl-aCnc: <14 IU/mL (ref <14)

## 2020-04-11 LAB — ANA: Anti Nuclear Antibody (ANA): NEGATIVE

## 2020-04-11 LAB — CYCLIC CITRUL PEPTIDE ANTIBODY, IGG: Cyclic Citrullin Peptide Ab: <16 UNITS

## 2020-04-15 ENCOUNTER — Telehealth: Payer: Self-pay | Admitting: Internal Medicine

## 2020-04-15 NOTE — Telephone Encounter (Signed)
Pt called returning call

## 2020-04-16 NOTE — Telephone Encounter (Signed)
Patient called & labs have been reviewed.

## 2020-06-05 ENCOUNTER — Encounter: Payer: Self-pay | Admitting: Obstetrics and Gynecology

## 2020-06-05 ENCOUNTER — Other Ambulatory Visit: Payer: Self-pay | Admitting: Obstetrics and Gynecology

## 2020-06-05 MED ORDER — DROSPIRENONE-ETHINYL ESTRADIOL 3-0.02 MG PO TABS: 1.0000 | ORAL_TABLET | Freq: Every day | ORAL | 0 refills | Status: DC

## 2020-06-05 NOTE — Progress Notes (Signed)
Rx RF yaz till annual.

## 2020-06-24 ENCOUNTER — Other Ambulatory Visit: Payer: Self-pay | Admitting: Internal Medicine

## 2020-06-24 DIAGNOSIS — F3289 Other specified depressive episodes: Secondary | ICD-10-CM

## 2020-06-24 DIAGNOSIS — F419 Anxiety disorder, unspecified: Secondary | ICD-10-CM

## 2020-06-24 DIAGNOSIS — E039 Hypothyroidism, unspecified: Secondary | ICD-10-CM

## 2020-06-24 MED ORDER — LEVOTHYROXINE SODIUM 112 MCG PO TABS: ORAL_TABLET | ORAL | 3 refills | Status: DC

## 2020-06-24 MED ORDER — BUSPIRONE HCL 10 MG PO TABS: 10.0000 mg | ORAL_TABLET | Freq: Two times a day (BID) | ORAL | 3 refills | Status: DC

## 2020-06-24 MED ORDER — BUPROPION HCL ER (XL) 300 MG PO TB24: 300.0000 mg | ORAL_TABLET | Freq: Every morning | ORAL | 3 refills | Status: DC

## 2020-08-06 ENCOUNTER — Ambulatory Visit: Payer: No Typology Code available for payment source | Admitting: Obstetrics and Gynecology

## 2020-08-25 ENCOUNTER — Encounter: Payer: Self-pay | Admitting: Obstetrics and Gynecology

## 2020-08-25 ENCOUNTER — Other Ambulatory Visit: Payer: Self-pay | Admitting: Obstetrics and Gynecology

## 2020-08-25 ENCOUNTER — Other Ambulatory Visit: Payer: Self-pay

## 2020-08-25 ENCOUNTER — Ambulatory Visit (INDEPENDENT_AMBULATORY_CARE_PROVIDER_SITE_OTHER): Payer: No Typology Code available for payment source | Admitting: Obstetrics and Gynecology

## 2020-08-25 VITALS — BP 110/80 | Ht 69.0 in | Wt 214.0 lb

## 2020-08-25 DIAGNOSIS — Z3041 Encounter for surveillance of contraceptive pills: Secondary | ICD-10-CM

## 2020-08-25 DIAGNOSIS — Z01419 Encounter for gynecological examination (general) (routine) without abnormal findings: Secondary | ICD-10-CM | POA: Diagnosis not present

## 2020-08-25 DIAGNOSIS — N921 Excessive and frequent menstruation with irregular cycle: Secondary | ICD-10-CM | POA: Diagnosis not present

## 2020-08-25 MED ORDER — DROSPIRENONE-ETHINYL ESTRADIOL 3-0.02 MG PO TABS: 1.0000 | ORAL_TABLET | Freq: Every day | ORAL | 3 refills | Status: DC

## 2020-08-25 NOTE — Progress Notes (Signed)
PCP:  McLean-Scocuzza, Pasty Spillers, MD   Chief Complaint  Patient presents with  . Gynecologic Exam     HPI:      [name] is a 34 y.o. [id number] who LMP was Patient's last menstrual period was 08/07/2020 (exact date)., presents today for her annual examination.  Her menses are still irreg since birth of last child (4 yrs ago). Pt did POP while breastfeeding and then changed to Lo Loestrin 3/19. Had BTB for many months, so changed to Aviane 10/19. Had menses sometimes monthly on placebo pills, sometimes on active pills and some months not at all, lasting 5-7 days. Had neg GYN u/s for BTB 10/20. Changed to yaz. Has monthly period, usually on placebo pills or after starting new pill pack, lasting 3-5 days. No late/missed pills. Thinks maybe different generic pills each time causes BTB. Has mildy dysmen. Declines nuvaring, other BC. Pt is euthyroid on levo. Hx of endometrial polyp in past that resolved.   Sex activity: single partner, contraception - OCP (estrogen/progesterone).  Last Pap: 04/12/18  Results were: no abnormalities /neg HPV DNA Hx of STDs: none  There is no FH of breast cancer. There is no FH of ovarian cancer. The patient does do self-breast exams.  Tobacco use: The patient denies current or previous tobacco use. Alcohol use: social drinker No drug use.  Exercise: moderately active  She does get adequate calcium and Vitamin D in her diet.  Labs with PCP. Euthyroid 4/20.  Has anxiety/depression, doing pretty well with wellbutrin and buspar combination. Managed by PCP.    Past Medical History:  Diagnosis Date  . Anxiety   . COVID-19    10/2019  . Depression   . Hypothyroidism   . Iron deficiency   . Obesity   . Thyroid disease     Past Surgical History:  Procedure Laterality Date  . CESAREAN SECTION  07/2008  . CESAREAN SECTION      Family History  Problem Relation Age of Onset  . Hyperlipidemia Mother   . Depression Mother   . Hypertension  Mother   . Hypothyroidism Mother   . Thyroid disease Mother   . Hypertension Father   . Diabetes Father   . Asthma Father   . Heart disease Father   . Hyperlipidemia Father   . Lupus Sister   . Hypothyroidism Sister   . Asthma Sister   . Miscarriages / Stillbirths Sister   . Depression Son   . Heart disease Maternal Grandmother   . Dementia Maternal Grandmother   . Thyroid disease Maternal Grandmother   . Thyroid disease Maternal Grandfather   . Colon cancer Maternal Grandfather 80  . Melanoma Maternal Grandfather 80  . Cancer Paternal Grandfather        skin   . Hashimoto's thyroiditis Sister        Thyroid nodules present, treated surgically  . Lupus Sister     Social History   Socioeconomic History  . Marital status: Married    Spouse name: Not on file  . Number of children: 1  . Years of education: Not on file  . Highest education level: Not on file  Occupational History  . Not on file  Tobacco Use  . Smoking status: Former Games developer  . Smokeless tobacco: Never Used  . Tobacco comment: quit smoking 2009   Vaping Use  . Vaping Use: Never used  Substance and Sexual Activity  . Alcohol use: Yes  . Drug use: No  .  Sexual activity: Yes    Birth control/protection: Pill  Other Topics Concern  . 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 Difficulty of Paying Living Expenses: Not on file  Food Insecurity:   . Worried About Programme researcher, broadcasting/film/video in the Last Year: Not on file  . Ran Out of Food in the Last Year: Not on file  Transportation Needs:   . Lack of Transportation (Medical): Not on file  . Lack of Transportation (Non-Medical): Not on file  Physical Activity:   . Days of Exercise per Week: Not on file  . Minutes of Exercise per Session: Not on file  Stress:   . Feeling of  Stress : Not on file  Social Connections:   . Frequency of Communication with Friends and Family: Not on file  . Frequency of Social Gatherings with Friends and Family: Not on file  . Attends Religious Services: Not on file  . Active Member of Clubs or Organizations: Not on file  . Attends Banker Meetings: Not on file  . Marital Status: Not on file  Intimate Partner Violence:   . Fear of Current or Ex-Partner: Not on file  . Emotionally Abused: Not on file  . Physically Abused: Not on file  . Sexually Abused: Not on file    Outpatient Medications Prior to Visit  Medication Sig Dispense Refill  . buPROPion (WELLBUTRIN XL) 300 MG 24 hr tablet Take 1 tablet (300 mg total) by mouth every morning. 90 tablet 3  . busPIRone (BUSPAR) 10 MG tablet Take 1 tablet (10 mg total) by mouth 2 (two) times daily. 180 tablet 3  . levothyroxine (SYNTHROID) 112 MCG tablet 1 tablet daily in am and 1.5 tablets on Wednesdays 100 tablet 3  . drospirenone-ethinyl estradiol (YAZ) 3-0.02 MG tablet Take 1 tablet by mouth daily. 84 tablet 0  . LORazepam (ATIVAN) 0.5 MG tablet Take 1-2 tablets (0.5-1 mg total) by mouth daily as needed for anxiety. (Patient not taking: Reported on 04/09/2020) 30 tablet 0  . fluticasone (FLONASE) 50 MCG/ACT nasal spray Place 2 sprays into both nostrils daily. (Patient not taking: Reported on 04/09/2020) 16 g 0  . Levocetirizine Dihydrochloride (XYZAL PO) Take by mouth.    . phentermine (ADIPEX-P) 37.5 MG tablet Take 1 tablet (37.5 mg total) by mouth daily before breakfast. 60 tablet 0  . phentermine (ADIPEX-P) 37.5 MG tablet Take 1 tablet (37.5 mg total) by mouth daily before breakfast. 60 tablet 0   No facility-administered medications prior to visit.    ROS:  Review of Systems  Constitutional: Negative for fatigue, fever and unexpected weight change.  Respiratory: Negative for cough, shortness of breath and wheezing.   Cardiovascular: Negative for chest pain,  palpitations and leg swelling.  Gastrointestinal: Negative for blood in stool, constipation, diarrhea, nausea and vomiting.  Endocrine: Negative for cold intolerance, heat intolerance and polyuria.  Genitourinary: Negative for dyspareunia, dysuria, flank pain, frequency, genital sores, hematuria, menstrual problem, pelvic pain, urgency, vaginal bleeding, vaginal discharge and vaginal pain.  Musculoskeletal: Negative for back pain, joint swelling and myalgias.  Skin: Negative for rash.  Neurological: Negative for dizziness, syncope, light-headedness, numbness and headaches.  Hematological: Negative for adenopathy.  Psychiatric/Behavioral: Positive for agitation.  Negative for confusion, dysphoric mood, sleep disturbance and suicidal ideas. The patient is not nervous/anxious.   BREAST: No symptoms   Objective: BP 110/80   Ht 5\' 9"  (1.753 m)   Wt 214 lb (97.1 kg)   LMP 08/07/2020 (Exact Date)   BMI 31.60 kg/m    Physical Exam Constitutional:      Appearance: She is well-developed.  Genitourinary:     Vulva, vagina, cervix, uterus, right adnexa and left adnexa normal.     No vulval lesion or tenderness noted.     No vaginal discharge, erythema or tenderness.     No cervical polyp.     Uterus is not enlarged or tender.     No right or left adnexal mass present.     Right adnexa not tender.     Left adnexa not tender.  Rectum:     Guaiac result negative.     No anal fissure or external hemorrhoid.  Neck:     Thyroid: No thyromegaly.  Cardiovascular:     Rate and Rhythm: Normal rate and regular rhythm.     Heart sounds: Normal heart sounds. No murmur heard.   Pulmonary:     Effort: Pulmonary effort is normal.     Breath sounds: Normal breath sounds.  Chest:     Breasts:        Right: No mass, nipple discharge, skin change or tenderness.        Left: No mass, nipple discharge, skin change or tenderness.  Abdominal:     Palpations: Abdomen is soft.     Tenderness: There is no  abdominal tenderness. There is no guarding.  Musculoskeletal:        General: Normal range of motion.     Cervical back: Normal range of motion.  Neurological:     General: No focal deficit present.     Mental Status: She is alert and oriented to person, place, and time.     Cranial Nerves: No cranial nerve deficit.  Skin:    General: Skin is warm and dry.  Psychiatric:        Mood and Affect: Mood normal.        Behavior: Behavior normal.        Thought Content: Thought content normal.        Judgment: Judgment normal.  Vitals reviewed.     Assessment/Plan: Encounter for annual routine gynecological examination  Encounter for surveillance of contraceptive pills - Plan: drospirenone-ethinyl estradiol (YAZ) 3-0.02 MG tablet; OCP RF. F/u prn.   Breakthrough bleeding on OCPs--can request same generic pill from pharm; also can try cont dosing. F/u prn.   Meds ordered this encounter  Medications  . drospirenone-ethinyl estradiol (YAZ) 3-0.02 MG tablet    Sig: Take 1 tablet by mouth daily.    Dispense:  84 tablet    Refill:  3    Order Specific Question:   Supervising Provider    Answer:   12-15-1996 Nadara Mustard        GYN counsel adequate intake of calcium and vitamin D, diet and exercise     F/U  Return in about 1 year (around 08/25/2021).  Bruchy Mikel B. Damarious Holtsclaw, PA-C 08/25/2020 4:38 PM

## 2020-08-25 NOTE — Patient Instructions (Signed)
I value your feedback and entrusting us with your care. If you get a [facility] patient survey, I would appreciate you taking the time to let us know about your experience today. Thank you!  As of November 07, 2019, your lab results will be released to your MyChart immediately, before I even have a chance to see them. Please give me time to review them and contact you if there are any abnormalities. Thank you for your patience.  

## 2020-10-13 ENCOUNTER — Encounter: Payer: Self-pay | Admitting: Internal Medicine

## 2020-10-13 ENCOUNTER — Ambulatory Visit (INDEPENDENT_AMBULATORY_CARE_PROVIDER_SITE_OTHER): Payer: No Typology Code available for payment source | Admitting: Internal Medicine

## 2020-10-13 ENCOUNTER — Other Ambulatory Visit: Payer: Self-pay | Admitting: Internal Medicine

## 2020-10-13 ENCOUNTER — Other Ambulatory Visit: Payer: Self-pay

## 2020-10-13 VITALS — BP 118/70 | HR 81 | Temp 98.1°F | Ht 69.0 in | Wt 211.8 lb

## 2020-10-13 DIAGNOSIS — Z13818 Encounter for screening for other digestive system disorders: Secondary | ICD-10-CM

## 2020-10-13 DIAGNOSIS — E611 Iron deficiency: Secondary | ICD-10-CM | POA: Diagnosis not present

## 2020-10-13 DIAGNOSIS — E669 Obesity, unspecified: Secondary | ICD-10-CM

## 2020-10-13 DIAGNOSIS — E039 Hypothyroidism, unspecified: Secondary | ICD-10-CM

## 2020-10-13 DIAGNOSIS — F32A Depression, unspecified: Secondary | ICD-10-CM

## 2020-10-13 DIAGNOSIS — E785 Hyperlipidemia, unspecified: Secondary | ICD-10-CM

## 2020-10-13 DIAGNOSIS — F419 Anxiety disorder, unspecified: Secondary | ICD-10-CM | POA: Diagnosis not present

## 2020-10-13 LAB — CBC WITH DIFFERENTIAL/PLATELET
Basophils Absolute: 0.1 10*3/uL (ref 0.0–0.1)
Basophils Relative: 1.1 % (ref 0.0–3.0)
Eosinophils Absolute: 0.1 10*3/uL (ref 0.0–0.7)
Eosinophils Relative: 1.1 % (ref 0.0–5.0)
HCT: 40 % (ref 36.0–46.0)
Hemoglobin: 13.2 g/dL (ref 12.0–15.0)
Lymphocytes Relative: 28 % (ref 12.0–46.0)
Lymphs Abs: 1.8 10*3/uL (ref 0.7–4.0)
MCHC: 32.9 g/dL (ref 30.0–36.0)
MCV: 86 fl (ref 78.0–100.0)
Monocytes Absolute: 0.4 10*3/uL (ref 0.1–1.0)
Monocytes Relative: 6.7 % (ref 3.0–12.0)
Neutro Abs: 4 10*3/uL (ref 1.4–7.7)
Neutrophils Relative %: 63.1 % (ref 43.0–77.0)
Platelets: 284 10*3/uL (ref 150.0–400.0)
RBC: 4.66 Mil/uL (ref 3.87–5.11)
RDW: 12.5 % (ref 11.5–15.5)
WBC: 6.4 10*3/uL (ref 4.0–10.5)

## 2020-10-13 LAB — COMPREHENSIVE METABOLIC PANEL
ALT: 17 U/L (ref 0–35)
AST: 16 U/L (ref 0–37)
Albumin: 4.5 g/dL (ref 3.5–5.2)
Alkaline Phosphatase: 93 U/L (ref 39–117)
BUN: 12 mg/dL (ref 6–23)
CO2: 28 mEq/L (ref 19–32)
Calcium: 9.8 mg/dL (ref 8.4–10.5)
Chloride: 101 mEq/L (ref 96–112)
Creatinine, Ser: 0.94 mg/dL (ref 0.40–1.20)
GFR: 79.65 mL/min (ref >60.00)
Glucose, Bld: 83 mg/dL (ref 70–99)
Potassium: 4.2 mEq/L (ref 3.5–5.1)
Sodium: 137 mEq/L (ref 135–145)
Total Bilirubin: 0.4 mg/dL (ref 0.2–1.2)
Total Protein: 7.5 g/dL (ref 6.0–8.3)

## 2020-10-13 LAB — LIPID PANEL
Cholesterol: 223 mg/dL — ABNORMAL HIGH (ref 0–200)
HDL: 73 mg/dL (ref >39.00)
LDL Cholesterol: 131 mg/dL — ABNORMAL HIGH (ref 0–99)
NonHDL: 150.18
Total CHOL/HDL Ratio: 3
Triglycerides: 98 mg/dL (ref 0.0–149.0)
VLDL: 19.6 mg/dL (ref 0.0–40.0)

## 2020-10-13 LAB — T4, FREE: Free T4: 0.98 ng/dL (ref 0.60–1.60)

## 2020-10-13 LAB — TSH: TSH: 2.97 u[IU]/mL (ref 0.35–4.50)

## 2020-10-13 MED ORDER — LORAZEPAM 0.5 MG PO TABS: 0.5000 mg | ORAL_TABLET | Freq: Every day | ORAL | 0 refills | Status: DC | PRN

## 2020-10-13 MED ORDER — PHENTERMINE HCL 37.5 MG PO TABS: 37.5000 mg | ORAL_TABLET | Freq: Every day | ORAL | 0 refills | Status: DC

## 2020-10-13 NOTE — Addendum Note (Signed)
Addended by: Bonnell Public I on: 10/13/2020 09:41 AM   Modules accepted: Orders

## 2020-10-13 NOTE — Progress Notes (Signed)
Chief Complaint  Patient presents with  . Follow-up   F/u  1. H/a and neck pain massage helps  2. HLD  3. Hypothyroidism FH + 112 mcg qd and 1.5 Weds. Controlled   Review of Systems  Constitutional: Negative for weight loss.  HENT: Negative for hearing loss.   Eyes: Negative for blurred vision.  Respiratory: Negative for shortness of breath.   Cardiovascular: Negative for chest pain.  Gastrointestinal: Negative for abdominal pain.  Musculoskeletal: Negative for falls.  Neurological: Negative for headaches.  Psychiatric/Behavioral: Negative for depression. The patient is nervous/anxious.    Past Medical History:  Diagnosis Date  . Anxiety   . COVID-19    10/2019  . Depression   . Hypothyroidism   . Iron deficiency   . Obesity   . Thyroid disease    Past Surgical History:  Procedure Laterality Date  . CESAREAN SECTION  07/2008  . CESAREAN SECTION     Family History  Problem Relation Age of Onset  . Hyperlipidemia Mother   . Depression Mother   . Hypertension Mother   . Hypothyroidism Mother   . Thyroid disease Mother   . Hypertension Father   . Diabetes Father   . Asthma Father   . Heart disease Father   . Hyperlipidemia Father   . Lupus Sister   . Hypothyroidism Sister        hashimotos  . Asthma Sister   . Miscarriages / Stillbirths Sister   . Depression Son   . Heart disease Maternal Grandmother   . Dementia Maternal Grandmother   . Thyroid disease Maternal Grandmother   . Thyroid disease Maternal Grandfather   . Colon cancer Maternal Grandfather 31  . Melanoma Maternal Grandfather 80  . Cancer Paternal Grandfather        skin    Social History   Socioeconomic History  . Marital status: Married    Spouse name: Not on file  . Number of children: 1  . Years of education: Not on file  . Highest education level: Not on file  Occupational History  . Not on file  Tobacco Use  . Smoking status: Former Research scientist (life sciences)  . Smokeless tobacco: Never Used  .  Tobacco comment: quit smoking 2009   Vaping Use  . Vaping Use: Never used  Substance and Sexual Activity  . Alcohol use: Yes  . Drug use: No  . Sexual activity: Yes    Birth control/protection: Pill  Other Topics Concern  . 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 Difficulty of Paying Living Expenses: Not on file  Food Insecurity:   . Worried About Charity fundraiser in the Last Year: Not on file  . Ran Out of Food in the Last Year: Not on file  Transportation Needs:   . Lack of Transportation (Medical): Not on file  . Lack of Transportation (Non-Medical): Not on file  Physical Activity:   . Days of Exercise per Week: Not on file  . Minutes of Exercise per Session: Not on file  Stress:   . Feeling of Stress : Not on file  Social Connections:   . Frequency of Communication with Friends and Family: Not on file  . Frequency of  Social Gatherings with Friends and Family: Not on file  . Attends Religious Services: Not on file  . Active Member of Clubs or Organizations: Not on file  . Attends Archivist Meetings: Not on file  . Marital Status: Not on file  Intimate Partner Violence:   . Fear of Current or Ex-Partner: Not on file  . Emotionally Abused: Not on file  . Physically Abused: Not on file  . Sexually Abused: Not on file   Current Meds  Medication Sig  . buPROPion (WELLBUTRIN XL) 300 MG 24 hr tablet Take 1 tablet (300 mg total) by mouth every morning.  . busPIRone (BUSPAR) 10 MG tablet Take 1 tablet (10 mg total) by mouth 2 (two) times daily.  . drospirenone-ethinyl estradiol (YAZ) 3-0.02 MG tablet Take 1 tablet by mouth daily.  Marland Kitchen levothyroxine (SYNTHROID) 112 MCG tablet 1 tablet daily in am and 1.5 tablets on Wednesdays  . Multiple Vitamins-Minerals (MULTIPLE VITAMINS/WOMENS PO)  Take by mouth.  . Red Yeast Rice Extract (RED YEAST RICE PO) Take by mouth.  Marland Kitchen VITAMIN D PO Take by mouth.   No Known Allergies No results found for this or any previous visit (from the past 2160 hour(s)). Objective  Body mass index is 31.28 kg/m. Wt Readings from Last 3 Encounters:  10/13/20 211 lb 12.8 oz (96.1 kg)  08/25/20 214 lb (97.1 kg)  04/09/20 216 lb 12.8 oz (98.3 kg)   Temp Readings from Last 3 Encounters:  10/13/20 98.1 F (36.7 C) (Oral)  04/09/20 (!) 97.2 F (36.2 C) (Temporal)  10/10/19 97.8 F (36.6 C) (Skin)   BP Readings from Last 3 Encounters:  10/13/20 118/70  08/25/20 110/80  04/09/20 128/78   Pulse Readings from Last 3 Encounters:  10/13/20 81  04/09/20 81  10/10/19 83    Physical Exam Vitals reviewed.  Constitutional:      Appearance: Normal appearance. She is well-developed and well-groomed. She is obese.  HENT:     Head: Normocephalic and atraumatic.  Eyes:     Conjunctiva/sclera: Conjunctivae normal.     Pupils: Pupils are equal, round, and reactive to light.  Cardiovascular:     Rate and Rhythm: Normal rate and regular rhythm.     Heart sounds: Normal heart sounds.  Skin:    General: Skin is warm and moist.  Neurological:     General: No focal deficit present.     Mental Status: She is alert and oriented to person, place, and time.     Gait: Gait normal.  Psychiatric:        Attention and Perception: Attention and perception normal.        Mood and Affect: Mood and affect normal.        Speech: Speech normal.        Behavior: Behavior normal. Behavior is cooperative.        Thought Content: Thought content normal.        Cognition and Memory: Cognition and memory normal.        Judgment: Judgment normal.     Assessment  Plan  Hypothyroidism, unspecified type - Plan: TSH, T4, free, Thyroid peroxidase antibody Levo 112 mcg qd and 1.5 W  Anxiety and depression controlled  - Plan: Comprehensive metabolic panel, Lipid panel, CBC  with Differential/Platelet  Iron deficiency - Plan: CBC with Differential/Platelet, Iron, TIBC and Ferritin Panel  Hyperlipidemia, unspecified hyperlipidemia type - Plan: Comprehensive metabolic panel, Lipid panel, CBC with Differential/Platelet  Anxiety - Plan: LORazepam (ATIVAN)  0.5 MG tablet  Obesity (BMI 30-39.9) - Plan: phentermine (ADIPEX-P) 37.5 MG tablet, phentermine (ADIPEX-P) 37.5 MG tablet  rec healthy diet and exercise   HM Flu shotutd Tdap in 2017 MMR immune Pfizer 2/2  Had HPV vaccine  [facility] neg pap neg hpv disc irregular menses with alicia copeland -normal cycles as of 09/2020 ocp was being changed different generics with pharmacies  Colonoscopy 49  Mammogram 34 y.o [facility] 7/2020normal f/u 7/2021nl watching right upper lip everything ok  rec healthy diet and exercise  eye MD seen 07/2020  Provider: Dr. Olivia Mackie McLean-Scocuzza-Internal Medicine

## 2020-10-13 NOTE — Patient Instructions (Addendum)
Nature made [place]   Cholesterol Content in Foods Cholesterol is a waxy, fat-like substance that helps to carry fat in the blood. The body needs cholesterol in small amounts, but too much cholesterol can cause damage to the arteries and heart. Most people should eat less than 200 milligrams (mg) of cholesterol a day. Foods with cholesterol  Cholesterol is found in animal-based foods, such as meat, seafood, and dairy. Generally, low-fat dairy and lean meats have less cholesterol than full-fat dairy and fatty meats. The milligrams of cholesterol per serving (mg per serving) of common cholesterol-containing foods are listed below. Meat and other proteins  Egg -- one large whole egg has 186 mg.  Veal shank -- 4 oz has 141 mg.  Lean ground [place] (93% lean) -- 4 oz has 118 mg.  Fat-trimmed lamb loin -- 4 oz has 106 mg.  Lean ground beef (90% lean) -- 4 oz has 100 mg.  Lobster -- 3.5 oz has 90 mg.  Pork loin chops -- 4 oz has 86 mg.  Canned salmon -- 3.5 oz has 83 mg.  Fat-trimmed beef top loin -- 4 oz has 78 mg.  Frankfurter -- 1 frank (3.5 oz) has 77 mg.  Crab -- 3.5 oz has 71 mg.  Roasted chicken without skin, white meat -- 4 oz has 66 mg.  Light bologna -- 2 oz has 45 mg.  Deli-cut [place] -- 2 oz has 31 mg.  Canned tuna -- 3.5 oz has 31 mg.  Tomasa Blase -- 1 oz has 29 mg.  Oysters and mussels (raw) -- 3.5 oz has 25 mg.  Mackerel -- 1 oz has 22 mg.  Trout -- 1 oz has 20 mg.  Pork sausage -- 1 link (1 oz) has 17 mg.  Salmon -- 1 oz has 16 mg.  Tilapia -- 1 oz has 14 mg. Dairy  Soft-serve ice cream --  cup (4 oz) has 103 mg.  Whole-milk yogurt -- 1 cup (8 oz) has 29 mg.  Cheddar cheese -- 1 oz has 28 mg.  American cheese -- 1 oz has 28 mg.  Whole milk -- 1 cup (8 oz) has 23 mg.  2% milk -- 1 cup (8 oz) has 18 mg.  Cream cheese -- 1 tablespoon (Tbsp) has 15 mg.  Cottage cheese --  cup (4 oz) has 14 mg.  Low-fat (1%) milk -- 1 cup (8 oz) has 10  mg.  Sour cream -- 1 Tbsp has 8.5 mg.  Low-fat yogurt -- 1 cup (8 oz) has 8 mg.  Nonfat Greek yogurt -- 1 cup (8 oz) has 7 mg.  Half-and-half cream -- 1 Tbsp has 5 mg. Fats and oils  Cod liver oil -- 1 tablespoon (Tbsp) has 82 mg.  Butter -- 1 Tbsp has 15 mg.  Lard -- 1 Tbsp has 14 mg.  Bacon grease -- 1 Tbsp has 14 mg.  Mayonnaise -- 1 Tbsp has 5-10 mg.  Margarine -- 1 Tbsp has 3-10 mg. Exact amounts of cholesterol in these foods may vary depending on specific ingredients and brands. Foods without cholesterol Most plant-based foods do not have cholesterol unless you combine them with a food that has cholesterol. Foods without cholesterol include:  Grains and cereals.  Vegetables.  Fruits.  Vegetable oils, such as olive, canola, and sunflower oil.  Legumes, such as peas, beans, and lentils.  Nuts and seeds.  Egg whites. Summary  The body needs cholesterol in small amounts, but too much cholesterol can cause damage to  the arteries and heart.  Most people should eat less than 200 milligrams (mg) of cholesterol a day. This information is not intended to replace advice given to you by your health care provider. Make sure you discuss any questions you have with your health care provider. Document Revised: 10/27/2017 Document Reviewed: 07/11/2017 Elsevier Patient Education  2020 [employer].  Cholesterol Content in Foods Cholesterol is a waxy, fat-like substance that helps to carry fat in the blood. The body needs cholesterol in small amounts, but too much cholesterol can cause damage to the arteries and heart. Most people should eat less than 200 milligrams (mg) of cholesterol a day. Foods with cholesterol  Cholesterol is found in animal-based foods, such as meat, seafood, and dairy. Generally, low-fat dairy and lean meats have less cholesterol than full-fat dairy and fatty meats. The milligrams of cholesterol per serving (mg per serving) of common  cholesterol-containing foods are listed below. Meat and other proteins  Egg -- one large whole egg has 186 mg.  Veal shank -- 4 oz has 141 mg.  Lean ground [place] (93% lean) -- 4 oz has 118 mg.  Fat-trimmed lamb loin -- 4 oz has 106 mg.  Lean ground beef (90% lean) -- 4 oz has 100 mg.  Lobster -- 3.5 oz has 90 mg.  Pork loin chops -- 4 oz has 86 mg.  Canned salmon -- 3.5 oz has 83 mg.  Fat-trimmed beef top loin -- 4 oz has 78 mg.  Frankfurter -- 1 frank (3.5 oz) has 77 mg.  Crab -- 3.5 oz has 71 mg.  Roasted chicken without skin, white meat -- 4 oz has 66 mg.  Light bologna -- 2 oz has 45 mg.  Deli-cut [place] -- 2 oz has 31 mg.  Canned tuna -- 3.5 oz has 31 mg.  Tomasa Blase -- 1 oz has 29 mg.  Oysters and mussels (raw) -- 3.5 oz has 25 mg.  Mackerel -- 1 oz has 22 mg.  Trout -- 1 oz has 20 mg.  Pork sausage -- 1 link (1 oz) has 17 mg.  Salmon -- 1 oz has 16 mg.  Tilapia -- 1 oz has 14 mg. Dairy  Soft-serve ice cream --  cup (4 oz) has 103 mg.  Whole-milk yogurt -- 1 cup (8 oz) has 29 mg.  Cheddar cheese -- 1 oz has 28 mg.  American cheese -- 1 oz has 28 mg.  Whole milk -- 1 cup (8 oz) has 23 mg.  2% milk -- 1 cup (8 oz) has 18 mg.  Cream cheese -- 1 tablespoon (Tbsp) has 15 mg.  Cottage cheese --  cup (4 oz) has 14 mg.  Low-fat (1%) milk -- 1 cup (8 oz) has 10 mg.  Sour cream -- 1 Tbsp has 8.5 mg.  Low-fat yogurt -- 1 cup (8 oz) has 8 mg.  Nonfat Greek yogurt -- 1 cup (8 oz) has 7 mg.  Half-and-half cream -- 1 Tbsp has 5 mg. Fats and oils  Cod liver oil -- 1 tablespoon (Tbsp) has 82 mg.  Butter -- 1 Tbsp has 15 mg.  Lard -- 1 Tbsp has 14 mg.  Bacon grease -- 1 Tbsp has 14 mg.  Mayonnaise -- 1 Tbsp has 5-10 mg.  Margarine -- 1 Tbsp has 3-10 mg. Exact amounts of cholesterol in these foods may vary depending on specific ingredients and brands. Foods without cholesterol Most plant-based foods do not have cholesterol unless you combine  them with a food  that has cholesterol. Foods without cholesterol include:  Grains and cereals.  Vegetables.  Fruits.  Vegetable oils, such as olive, canola, and sunflower oil.  Legumes, such as peas, beans, and lentils.  Nuts and seeds.  Egg whites. Summary  The body needs cholesterol in small amounts, but too much cholesterol can cause damage to the arteries and heart.  Most people should eat less than 200 milligrams (mg) of cholesterol a day. This information is not intended to replace advice given to you by your health care provider. Make sure you discuss any questions you have with your health care provider. Document Revised: 10/27/2017 Document Reviewed: 07/11/2017 Elsevier Patient Education  [place].

## 2020-10-14 LAB — IRON,TIBC AND FERRITIN PANEL
%SAT: 23 % (calc) (ref 16–45)
Ferritin: 28 ng/mL (ref 16–154)
Iron: 103 ug/dL (ref 40–190)
TIBC: 447 mcg/dL (calc) (ref 250–450)

## 2020-10-14 LAB — HEPATITIS C ANTIBODY
Hepatitis C Ab: NONREACTIVE
SIGNAL TO CUT-OFF: 0.01 (ref <1.00)

## 2020-10-14 LAB — THYROID PEROXIDASE ANTIBODY: Thyroperoxidase Ab SerPl-aCnc: 191 IU/mL — ABNORMAL HIGH (ref <9)

## 2021-04-01 ENCOUNTER — Other Ambulatory Visit: Payer: Self-pay

## 2021-04-01 MED FILL — Bupropion HCl Tab ER 24HR 300 MG: ORAL | 90 days supply | Qty: 90 | Fill #0 | Status: AC

## 2021-04-16 ENCOUNTER — Other Ambulatory Visit: Payer: Self-pay

## 2021-04-16 ENCOUNTER — Ambulatory Visit (INDEPENDENT_AMBULATORY_CARE_PROVIDER_SITE_OTHER): Payer: No Typology Code available for payment source | Admitting: Internal Medicine

## 2021-04-16 ENCOUNTER — Encounter: Payer: Self-pay | Admitting: Internal Medicine

## 2021-04-16 VITALS — BP 106/70 | HR 79 | Temp 98.1°F | Ht 68.58 in | Wt 221.2 lb

## 2021-04-16 DIAGNOSIS — E063 Autoimmune thyroiditis: Secondary | ICD-10-CM | POA: Diagnosis not present

## 2021-04-16 DIAGNOSIS — R591 Generalized enlarged lymph nodes: Secondary | ICD-10-CM

## 2021-04-16 DIAGNOSIS — M542 Cervicalgia: Secondary | ICD-10-CM

## 2021-04-16 DIAGNOSIS — E039 Hypothyroidism, unspecified: Secondary | ICD-10-CM | POA: Diagnosis not present

## 2021-04-16 DIAGNOSIS — R5383 Other fatigue: Secondary | ICD-10-CM

## 2021-04-16 DIAGNOSIS — F3289 Other specified depressive episodes: Secondary | ICD-10-CM

## 2021-04-16 DIAGNOSIS — E538 Deficiency of other specified B group vitamins: Secondary | ICD-10-CM

## 2021-04-16 DIAGNOSIS — Z1389 Encounter for screening for other disorder: Secondary | ICD-10-CM

## 2021-04-16 DIAGNOSIS — M62838 Other muscle spasm: Secondary | ICD-10-CM | POA: Diagnosis not present

## 2021-04-16 DIAGNOSIS — E785 Hyperlipidemia, unspecified: Secondary | ICD-10-CM | POA: Diagnosis not present

## 2021-04-16 DIAGNOSIS — Z1283 Encounter for screening for malignant neoplasm of skin: Secondary | ICD-10-CM

## 2021-04-16 DIAGNOSIS — Z0000 Encounter for general adult medical examination without abnormal findings: Secondary | ICD-10-CM | POA: Diagnosis not present

## 2021-04-16 DIAGNOSIS — R946 Abnormal results of thyroid function studies: Secondary | ICD-10-CM

## 2021-04-16 DIAGNOSIS — F419 Anxiety disorder, unspecified: Secondary | ICD-10-CM

## 2021-04-16 DIAGNOSIS — E669 Obesity, unspecified: Secondary | ICD-10-CM

## 2021-04-16 LAB — COMPREHENSIVE METABOLIC PANEL
ALT: 18 U/L (ref 0–35)
AST: 15 U/L (ref 0–37)
Albumin: 4.3 g/dL (ref 3.5–5.2)
Alkaline Phosphatase: 79 U/L (ref 39–117)
BUN: 14 mg/dL (ref 6–23)
CO2: 27 mEq/L (ref 19–32)
Calcium: 9.5 mg/dL (ref 8.4–10.5)
Chloride: 103 mEq/L (ref 96–112)
Creatinine, Ser: 0.87 mg/dL (ref 0.40–1.20)
GFR: 87.09 mL/min (ref >60.00)
Glucose, Bld: 86 mg/dL (ref 70–99)
Potassium: 4.1 mEq/L (ref 3.5–5.1)
Sodium: 139 mEq/L (ref 135–145)
Total Bilirubin: 0.4 mg/dL (ref 0.2–1.2)
Total Protein: 7 g/dL (ref 6.0–8.3)

## 2021-04-16 LAB — CBC WITH DIFFERENTIAL/PLATELET
Basophils Absolute: 0.1 10*3/uL (ref 0.0–0.1)
Basophils Relative: 1 % (ref 0.0–3.0)
Eosinophils Absolute: 0.1 10*3/uL (ref 0.0–0.7)
Eosinophils Relative: 1.6 % (ref 0.0–5.0)
HCT: 39.7 % (ref 36.0–46.0)
Hemoglobin: 13.3 g/dL (ref 12.0–15.0)
Lymphocytes Relative: 30.7 % (ref 12.0–46.0)
Lymphs Abs: 1.7 10*3/uL (ref 0.7–4.0)
MCHC: 33.5 g/dL (ref 30.0–36.0)
MCV: 85.6 fl (ref 78.0–100.0)
Monocytes Absolute: 0.4 10*3/uL (ref 0.1–1.0)
Monocytes Relative: 6.7 % (ref 3.0–12.0)
Neutro Abs: 3.3 10*3/uL (ref 1.4–7.7)
Neutrophils Relative %: 60 % (ref 43.0–77.0)
Platelets: 283 10*3/uL (ref 150.0–400.0)
RBC: 4.64 Mil/uL (ref 3.87–5.11)
RDW: 13 % (ref 11.5–15.5)
WBC: 5.5 10*3/uL (ref 4.0–10.5)

## 2021-04-16 LAB — LIPID PANEL
Cholesterol: 240 mg/dL — ABNORMAL HIGH (ref 0–200)
HDL: 75.8 mg/dL (ref >39.00)
LDL Cholesterol: 147 mg/dL — ABNORMAL HIGH (ref 0–99)
NonHDL: 164.35
Total CHOL/HDL Ratio: 3
Triglycerides: 85 mg/dL (ref 0.0–149.0)
VLDL: 17 mg/dL (ref 0.0–40.0)

## 2021-04-16 LAB — TSH: TSH: 2.19 u[IU]/mL (ref 0.35–4.50)

## 2021-04-16 LAB — VITAMIN B12: Vitamin B-12: 196 pg/mL — ABNORMAL LOW (ref 211–911)

## 2021-04-16 LAB — TIQ-MISC

## 2021-04-16 LAB — T4, FREE: Free T4: 0.91 ng/dL (ref 0.60–1.60)

## 2021-04-16 MED ORDER — PHENTERMINE HCL 37.5 MG PO TABS
ORAL_TABLET | Freq: Every day | ORAL | 0 refills | Status: DC
  Filled 2021-04-16: qty 60, 60d supply, fill #0

## 2021-04-16 MED ORDER — BUPROPION HCL ER (XL) 300 MG PO TB24
ORAL_TABLET | Freq: Every morning | ORAL | 3 refills | Status: DC
  Filled 2021-04-16 – 2021-06-29 (×2): qty 90, 90d supply, fill #0
  Filled 2021-09-29: qty 90, 90d supply, fill #1
  Filled 2021-12-23: qty 90, 90d supply, fill #2
  Filled 2022-03-29: qty 90, 90d supply, fill #3

## 2021-04-16 MED ORDER — PHENTERMINE HCL 37.5 MG PO TABS
ORAL_TABLET | Freq: Every day | ORAL | 0 refills | Status: DC
  Filled 2021-04-16 – 2021-06-11 (×2): qty 60, 60d supply, fill #0

## 2021-04-16 MED ORDER — TIZANIDINE HCL 2 MG PO TABS
2.0000 mg | ORAL_TABLET | Freq: Every evening | ORAL | 2 refills | Status: DC | PRN
  Filled 2021-04-16: qty 30, 30d supply, fill #0
  Filled 2021-06-29: qty 30, 30d supply, fill #1

## 2021-04-16 MED ORDER — BUSPIRONE HCL 10 MG PO TABS
ORAL_TABLET | Freq: Two times a day (BID) | ORAL | 3 refills | Status: DC
  Filled 2021-04-16 – 2021-06-29 (×2): qty 180, 90d supply, fill #0
  Filled 2021-10-19: qty 180, 90d supply, fill #1
  Filled 2022-03-29: qty 180, 90d supply, fill #2

## 2021-04-16 MED ORDER — LEVOTHYROXINE SODIUM 112 MCG PO TABS
ORAL_TABLET | ORAL | 3 refills | Status: DC
  Filled 2021-04-16: qty 100, 90d supply, fill #0
  Filled 2021-07-27: qty 100, 90d supply, fill #1
  Filled 2021-11-01: qty 100, 90d supply, fill #2
  Filled 2022-02-01: qty 100, 90d supply, fill #3

## 2021-04-16 NOTE — Progress Notes (Signed)
Chief Complaint  Patient presents with  . Annual Exam   Annual  1. 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 Obesity feels wt increases with stress and fh obesity and at times she overall eats healthy but stress may overeat 4. C/o right neck muscle spasm despite chiropractor and massage and neck pain   Review of Systems  Constitutional: Negative for weight loss.  HENT: Negative for hearing loss.   Eyes: Negative for blurred vision.  Respiratory: Negative for shortness of breath.   Cardiovascular: Negative for chest pain.  Gastrointestinal: Negative for abdominal pain.  Musculoskeletal: Negative for falls and joint pain.  Skin: Negative for rash.  Neurological: Negative for headaches.  Psychiatric/Behavioral: The patient is nervous/anxious.    Past Medical History:  Diagnosis Date  . Anxiety   . COVID-19    10/2019 11/2019  . Depression   . Hypothyroidism   . Iron deficiency   . Obesity   . Thyroid disease    Past Surgical History:  Procedure Laterality Date  . CESAREAN SECTION  07/2008  . CESAREAN SECTION     Family History  Problem Relation Age of Onset  . Hyperlipidemia Mother   . Depression Mother   . Hypertension Mother   . Hypothyroidism Mother   . Thyroid disease Mother   . Hypertension Father   . Diabetes Father   . Asthma Father   . Heart disease Father   . Hyperlipidemia Father   . Lupus Sister   . Hypothyroidism Sister        hashimotos thyroid removal for precancerous cells  . Asthma Sister   . Miscarriages / Stillbirths Sister   . Depression Son   . Heart disease Maternal Grandmother   . Dementia Maternal Grandmother   . Thyroid disease Maternal  Grandmother   . Thyroid disease Maternal Grandfather   . Colon cancer Maternal Grandfather 56  . Melanoma Maternal Grandfather 80  . Cancer Paternal Grandfather        skin    Social History   Socioeconomic History  . Marital status: Married    Spouse name: Not on file  . Number of children: 1  . Years of education: Not on file  . Highest education level: Not on file  Occupational History  . Not on file  Tobacco Use  . Smoking status: Former Research scientist (life sciences)  . Smokeless tobacco: Never Used  . Tobacco comment: quit smoking 2009   Vaping Use  . Vaping Use: Never used  Substance and Sexual Activity  . Alcohol use: Yes  . Drug use: No  . Sexual activity: Yes    Birth control/protection: Pill  Other Topics Concern  . 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 drospirenone-ethinyl estradiol (YAZ) 3-0.02 MG tablet TAKE 1 TABLET BY MOUTH DAILY. (USE NDC# [phone])  . Multiple Vitamins-Minerals (MULTIPLE VITAMINS/WOMENS PO) Take by mouth.  . Red Yeast Rice Extract (RED YEAST RICE PO) Take by mouth.  Marland Kitchen tiZANidine (ZANAFLEX) 2 MG tablet Take 1 tablet (2 mg total) by mouth at bedtime as needed for muscle spasms.  Marland Kitchen VITAMIN D PO Take by mouth.  . [DISCONTINUED] buPROPion (WELLBUTRIN XL) 300 MG 24 hr tablet TAKE 1 TABLET BY MOUTH EVERY MORNING.  . [DISCONTINUED] busPIRone (BUSPAR) 10 MG tablet TAKE 1 TABLET BY MOUTH 2 TIMES DAILY.  . [DISCONTINUED] levothyroxine (SYNTHROID) 112 MCG tablet TAKE 1 TABLET DAILY IN MORNING AND 1 & 1/2 TABLETS ON WEDNESDAYS   No Known  Allergies No results found for this or any previous visit (from the past 2160 hour(s)). Objective  Body mass index is 33.06 kg/m. Wt Readings from Last 3 Encounters:  04/16/21 221 lb 3.2 oz (100.3 kg)  10/13/20 211 lb 12.8 oz (96.1 kg)  08/25/20 214 lb (97.1 kg)   Temp Readings from Last 3 Encounters:  04/16/21 98.1 F (36.7 C) (Oral)  10/13/20 98.1 F (36.7 C) (Oral)  04/09/20 (!) 97.2 F (36.2 C) (Temporal)   BP Readings from Last 3 Encounters:  04/16/21 106/70  10/13/20 118/70  08/25/20 110/80   Pulse Readings from Last 3 Encounters:  04/16/21 79  10/13/20 81  04/09/20 81    Physical Exam Vitals and nursing note reviewed.  Constitutional:      Appearance: Normal appearance. She is well-developed and well-groomed. She is obese.  HENT:     Head: Normocephalic and atraumatic.  Cardiovascular:     Rate and Rhythm: Normal rate and regular rhythm.     Heart sounds: Normal heart sounds. No murmur heard.   Pulmonary:     Effort: Pulmonary effort is normal.     Breath sounds: Normal breath sounds.  Abdominal:     General: Abdomen is flat. Bowel sounds are normal.     Tenderness: There is no abdominal tenderness.  Skin:    General: Skin is warm and dry.  Neurological:     General: No focal deficit present.     Mental Status: She is alert and oriented to person, place, and time. Mental status is at baseline.     Gait: Gait normal.  Psychiatric:        Attention and Perception: Attention and perception normal.        Mood and Affect: Mood and affect normal.        Speech: Speech normal.        Behavior: Behavior normal. Behavior is cooperative.        Thought Content: Thought content normal.        Cognition and Memory: Cognition and memory normal.        Judgment: Judgment normal.     Assessment  Plan  Annual physical exam - Plan: Comprehensive metabolic panel   Cervicalgia - Plan: tiZANidine (ZANAFLEX) 2 MG tablet Muscle spasm - Plan: tiZANidine (ZANAFLEX)  2 MG tablet  Anxiety - Plan: busPIRone (BUSPAR) 10 MG tablet Other depression - Plan: buPROPion (WELLBUTRIN XL) 300 MG 24 hr tablet, busPIRone (BUSPAR) 10 MG tablet  Hypothyroidism, unspecified type - Plan: levothyroxine (SYNTHROID) 112 MCG tablet, TSH, Thyroid peroxidase antibody, T4, free Hashimoto's disease - Plan: TSH, Thyroid peroxidase antibody, T4, free, US THYROID Lymphadenopathy - Plan: US THYROID  Hyperlipidemia, unspecified hyperlipidemia type -  Plan: Lipid panel   Obesity (BMI 30-39.9) - Plan: phentermine (ADIPEX-P) 37.5 MG tablet, phentermine (ADIPEX-P) 37.5 MG tablet   HM Flu shotutd Tdap in 2017 MMR immune Pfizer 2/2 consider 3rd dose fall 202  Had HPV vaccine  [facility] neg pap neg hpv disc irregular menses with alicia copeland -normal cycles as of 09/2020 ocp was being changed different generics with pharmacies  [facility] 07/2021 [facility]   Colonoscopy 74  Mammogram 35 y.o no FH [address] 7/2020normal f/u 7/2021nl watching right upper lip everything ok  rec healthy diet and exercise eye MD seen 07/2020    Provider: Dr. Olivia Mackie McLean-Scocuzza-Internal Medicine

## 2021-04-19 ENCOUNTER — Encounter: Payer: Self-pay | Admitting: Internal Medicine

## 2021-04-19 ENCOUNTER — Other Ambulatory Visit: Payer: Self-pay

## 2021-04-19 DIAGNOSIS — E538 Deficiency of other specified B group vitamins: Secondary | ICD-10-CM | POA: Insufficient documentation

## 2021-04-19 LAB — THYROID PEROXIDASE ANTIBODY: Thyroperoxidase Ab SerPl-aCnc: 183 IU/mL — ABNORMAL HIGH (ref <9)

## 2021-04-19 LAB — CORTISOL-AM, BLOOD: Cortisol - AM: 13.6 ug/dL

## 2021-04-19 MED ORDER — "NEEDLE (DISP) 25G X 1-1/2"" MISC"
1.0000 | 1 refills | Status: DC
  Filled 2021-04-19: qty 12, fill #0

## 2021-04-19 MED ORDER — CYANOCOBALAMIN 1000 MCG/ML IJ SOLN
1000.0000 ug | INTRAMUSCULAR | 1 refills | Status: DC
  Filled 2021-04-19: qty 3, 90d supply, fill #0
  Filled 2021-07-15: qty 3, 90d supply, fill #1
  Filled 2021-10-19: qty 3, 90d supply, fill #2
  Filled 2022-02-01: qty 3, 90d supply, fill #3

## 2021-04-19 NOTE — Addendum Note (Signed)
Addended by: Quentin Ore on: 04/19/2021 04:42 PM   Modules accepted: Orders

## 2021-05-13 ENCOUNTER — Other Ambulatory Visit: Payer: Self-pay

## 2021-05-13 MED FILL — Drospirenone-Ethinyl Estradiol Tab 3-0.02 MG: ORAL | 84 days supply | Qty: 84 | Fill #0 | Status: AC

## 2021-05-25 ENCOUNTER — Other Ambulatory Visit: Payer: Self-pay

## 2021-05-25 ENCOUNTER — Ambulatory Visit
Admission: RE | Admit: 2021-05-25 | Discharge: 2021-05-25 | Disposition: A | Discharge status: Home / Self Care | Payer: No Typology Code available for payment source | Source: Ambulatory Visit | Attending: Internal Medicine | Admitting: Internal Medicine

## 2021-05-25 DIAGNOSIS — E063 Autoimmune thyroiditis: Secondary | ICD-10-CM | POA: Insufficient documentation

## 2021-05-25 DIAGNOSIS — R946 Abnormal results of thyroid function studies: Secondary | ICD-10-CM | POA: Diagnosis present

## 2021-05-25 DIAGNOSIS — R591 Generalized enlarged lymph nodes: Secondary | ICD-10-CM | POA: Diagnosis present

## 2021-05-25 MED ORDER — CARESTART COVID-19 HOME TEST VI KIT
PACK | 0 refills | Status: DC
  Filled 2021-05-25: qty 2, 4d supply, fill #0

## 2021-06-08 ENCOUNTER — Other Ambulatory Visit: Payer: Self-pay

## 2021-06-10 ENCOUNTER — Encounter: Payer: Self-pay | Admitting: Internal Medicine

## 2021-06-10 NOTE — Telephone Encounter (Signed)
Ok to fill early since leaving please contact [facility] and allow early fill of adipex   Thank you

## 2021-06-10 NOTE — Telephone Encounter (Signed)
Alright to okay early fill?

## 2021-06-11 ENCOUNTER — Other Ambulatory Visit: Payer: Self-pay

## 2021-06-11 MED ORDER — TRIAMCINOLONE ACETONIDE 0.1 % EX CREA
TOPICAL_CREAM | CUTANEOUS | 1 refills | Status: DC
  Filled 2021-06-11: qty 45, 20d supply, fill #0

## 2021-06-29 ENCOUNTER — Other Ambulatory Visit: Payer: Self-pay

## 2021-07-15 ENCOUNTER — Other Ambulatory Visit: Payer: Self-pay

## 2021-07-27 ENCOUNTER — Other Ambulatory Visit: Payer: Self-pay

## 2021-07-27 ENCOUNTER — Other Ambulatory Visit: Payer: Self-pay | Admitting: Obstetrics and Gynecology

## 2021-07-27 DIAGNOSIS — Z3041 Encounter for surveillance of contraceptive pills: Secondary | ICD-10-CM

## 2021-07-27 MED FILL — Drospirenone-Ethinyl Estradiol Tab 3-0.02 MG: ORAL | 56 days supply | Qty: 56 | Fill #0 | Status: AC

## 2021-07-28 ENCOUNTER — Other Ambulatory Visit: Payer: Self-pay

## 2021-08-30 NOTE — Progress Notes (Signed)
PCP:  McLean-Scocuzza, Nino Glow, MD   Chief Complaint  Patient presents with   Gynecologic Exam    No concerns    HPI:      [name] is a 35 y.o. [id number] who LMP was Patient's last menstrual period was 08/06/2021 (approximate)., presents today for her annual examination.  Her menses are infrequent on OCPs now, and occur only on placebo pills now, lasting 3-4 days, mod flow, mild dysmen. BTB resolved if pt stays on same generic Rx from pharmacy.   Sex activity: single partner, contraception - OCP (estrogen/progesterone).  Last Pap: 04/12/18  Results were: no abnormalities /neg HPV DNA Hx of STDs: none  There is no FH of breast cancer. There is no FH of ovarian cancer. The patient does do self-breast exams.  Tobacco use: The patient denies current or previous tobacco use. Alcohol use: rare No drug use.  Exercise: moderately active  She does get adequate calcium and Vitamin D in her diet.  Labs with PCP. Euthyroid 5/22.  Has anxiety/depression, doing pretty well with wellbutrin and buspar combination. Managed by PCP.    Past Medical History:  Diagnosis Date   Anxiety    COVID-19    10/2019 11/2019   Depression    Hypothyroidism    Iron deficiency    Obesity    Thyroid disease     Past Surgical History:  Procedure Laterality Date   CESAREAN SECTION  07/2008   CESAREAN SECTION      Family History  Problem Relation Age of Onset   Hyperlipidemia Mother    Depression Mother    Hypertension Mother    Hypothyroidism Mother    Thyroid disease Mother    Hypertension Father    Diabetes Father    Asthma Father    Heart disease Father    Hyperlipidemia Father    Lupus Sister    Hypothyroidism Sister        hashimotos thyroid removal for precancerous cells   Asthma Sister    Miscarriages / Stillbirths Sister    Depression Son    Heart disease Maternal Grandmother    Dementia Maternal Grandmother    Thyroid disease Maternal Grandmother    Thyroid  disease Maternal Grandfather    Colon cancer Maternal Grandfather 42   Melanoma Maternal Grandfather 27   Cancer Paternal Grandfather        skin     Social History   Socioeconomic History   Marital status: Married    Spouse name: Not on file   Number of children: 1   Years of education: Not on file   Highest education level: Not on file  Occupational History   Not on file  Tobacco Use   Smoking status: Former   Smokeless tobacco: Never   Tobacco comments:    quit smoking 2009   Vaping Use   Vaping Use: Never used  Substance and Sexual Activity   Alcohol use: Yes   Drug use: No   Sexual activity: Yes    Birth control/protection: Pill  Other Topics Concern   Not on file  Social History Narrative   Wears seat belt   Feels safe in relationship    3 kids 2 bio and 1 step son    Therapist, 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 90 tablet 3   busPIRone (BUSPAR) 10 MG tablet TAKE 1 TABLET BY MOUTH 2 TIMES DAILY. 180 tablet 3   cyanocobalamin (,VITAMIN B-12,) 1000 MCG/ML injection Inject 1 mL (1,000 mcg total) into the muscle every 30 (thirty) days. 12 mL 1   levothyroxine (SYNTHROID) 112 MCG tablet TAKE 1 TABLET DAILY IN MORNING AND 1 & 1/2 TABLETS ON WEDNESDAYS 100 tablet 3   Multiple Vitamins-Minerals (MULTIPLE VITAMINS/WOMENS PO) Take by mouth.     NEEDLE, DISP, 25 G 25G X 1-1/2" MISC 1 Device by Does not apply route every 30 (thirty) days. 12 each 1   tiZANidine (ZANAFLEX) 2 MG tablet Take 1 tablet (2 mg total) by mouth at bedtime as needed for muscle spasms. 30  tablet 2   triamcinolone cream (KENALOG) 0.1 % Apply twice daily to bites as needed. 45 g 1   VITAMIN D PO Take by mouth.     drospirenone-ethinyl estradiol (YAZ) 3-0.02 MG tablet TAKE 1 TABLET BY MOUTH DAILY. (USE NDC# [phone]) 84 tablet 0   COVID-19 At Home Antigen Test (CARESTART COVID-19 HOME TEST) KIT Take as directed 2 kit 0   phentermine (ADIPEX-P) 37.5 MG tablet TAKE 1 TABLET BY MOUTH DAILY BEFORE BREAKFAST. 60 tablet 0   phentermine (ADIPEX-P) 37.5 MG tablet TAKE 1 TABLET BY MOUTH DAILY BEFORE BREAKFAST. 60 tablet 0   Red Yeast Rice Extract (RED YEAST RICE PO) Take by mouth.     No facility-administered medications prior to visit.    ROS:  Review of Systems  Constitutional:  Negative for fatigue, fever and unexpected weight change.  Respiratory:  Negative for cough, shortness of breath and wheezing.   Cardiovascular:  Negative for chest pain, palpitations and leg swelling.  Gastrointestinal:  Negative for blood in stool, constipation, diarrhea, nausea and vomiting.  Endocrine: Negative for cold intolerance, heat intolerance and polyuria.  Genitourinary:  Negative for dyspareunia, dysuria, flank pain, frequency, genital sores, hematuria, menstrual problem, pelvic pain, urgency, vaginal bleeding, vaginal discharge and vaginal pain.  Musculoskeletal:  Negative for back pain, joint swelling and myalgias.  Skin:  Negative for rash.  Neurological:  Negative for dizziness, syncope, light-headedness, numbness and headaches.  Hematological:  Negative for adenopathy.  Psychiatric/Behavioral:  Positive for agitation. Negative for confusion, dysphoric mood, sleep disturbance and suicidal ideas. The patient is not nervous/anxious.  BREAST: No symptoms   Objective: BP 112/76   Ht 5' 8.5" (1.74 m)   Wt 204 lb (92.5 kg)   LMP 08/06/2021 (Approximate)   BMI 30.57 kg/m    Physical Exam Constitutional:      Appearance: She is well-developed.  Genitourinary:     Vulva normal.      Right Labia: No rash, tenderness or lesions.    Left Labia: No tenderness, lesions or rash.    No vaginal discharge, erythema or tenderness.      Right Adnexa: not tender and no mass present.    Left Adnexa: not tender and no mass present.    No cervical friability or polyp.     Uterus is not enlarged or tender.  Rectum:     Guaiac result negative.     No anal fissure or external hemorrhoid.  Breasts:  Right: No mass, nipple discharge, skin change or tenderness.     Left: No mass, nipple discharge, skin change or tenderness.  Neck:     Thyroid: No thyromegaly.  Cardiovascular:     Rate and Rhythm: Normal rate and regular rhythm.     Heart sounds: Normal heart sounds. No murmur heard. Pulmonary:     Effort: Pulmonary effort is normal.     Breath sounds: Normal breath sounds.  Abdominal:     Palpations: Abdomen is soft.     Tenderness: There is no abdominal tenderness. There is no guarding or rebound.  Musculoskeletal:        General: Normal range of motion.     Cervical back: Normal range of motion.  Lymphadenopathy:     Cervical: No cervical adenopathy.  Neurological:     General: No focal deficit present.     Mental Status: She is alert and oriented to person, place, and time.     Cranial Nerves: No cranial nerve deficit.  Skin:    General: Skin is warm and dry.  Psychiatric:        Mood and Affect: Mood normal.        Behavior: Behavior normal.        Thought Content: Thought content normal.        Judgment: Judgment normal.  Vitals reviewed.    Assessment/Plan: Encounter for annual routine gynecological examination  Cervical cancer screening - Plan: Cytology - PAP  Screening for HPV (human papillomavirus) - Plan: Cytology - PAP  Encounter for surveillance of contraceptive pills - Plan: drospirenone-ethinyl estradiol (YAZ) 3-0.02 MG tablet; OCP RF.   Breakthrough bleeding on OCPs--resolved if stays on same generic pills. F/u prn.    Meds ordered this  encounter  Medications   drospirenone-ethinyl estradiol (YAZ) 3-0.02 MG tablet    Sig: TAKE 1 TABLET BY MOUTH DAILY. (USE NDC# [phone])    Dispense:  84 tablet    Refill:  3    Order Specific Question:   Supervising Provider    Answer:   Gae Dry [[id number]]         GYN counsel adequate intake of calcium and vitamin D, diet and exercise     F/U  Return in about 1 year (around 08/31/2022).  Acheron Sugg B. Inika Bellanger, PA-C 08/31/2021 12:04 PM

## 2021-08-31 ENCOUNTER — Other Ambulatory Visit (HOSPITAL_COMMUNITY)
Admission: RE | Admit: 2021-08-31 | Discharge: 2021-08-31 | Disposition: A | Discharge status: Home / Self Care | Payer: No Typology Code available for payment source | Source: Ambulatory Visit | Attending: Obstetrics and Gynecology | Admitting: Obstetrics and Gynecology

## 2021-08-31 ENCOUNTER — Ambulatory Visit (INDEPENDENT_AMBULATORY_CARE_PROVIDER_SITE_OTHER): Payer: No Typology Code available for payment source | Admitting: Obstetrics and Gynecology

## 2021-08-31 ENCOUNTER — Encounter: Payer: Self-pay | Admitting: Obstetrics and Gynecology

## 2021-08-31 ENCOUNTER — Other Ambulatory Visit: Payer: Self-pay

## 2021-08-31 VITALS — BP 112/76 | Ht 68.5 in | Wt 204.0 lb

## 2021-08-31 DIAGNOSIS — Z3041 Encounter for surveillance of contraceptive pills: Secondary | ICD-10-CM

## 2021-08-31 DIAGNOSIS — Z01419 Encounter for gynecological examination (general) (routine) without abnormal findings: Secondary | ICD-10-CM | POA: Diagnosis not present

## 2021-08-31 DIAGNOSIS — Z124 Encounter for screening for malignant neoplasm of cervix: Secondary | ICD-10-CM

## 2021-08-31 DIAGNOSIS — N921 Excessive and frequent menstruation with irregular cycle: Secondary | ICD-10-CM

## 2021-08-31 DIAGNOSIS — Z1151 Encounter for screening for human papillomavirus (HPV): Secondary | ICD-10-CM | POA: Diagnosis present

## 2021-08-31 MED ORDER — DROSPIRENONE-ETHINYL ESTRADIOL 3-0.02 MG PO TABS
ORAL_TABLET | ORAL | 3 refills | Status: DC
  Filled 2021-08-31: qty 84, fill #0
  Filled 2021-09-29: qty 84, 84d supply, fill #0
  Filled 2021-12-23: qty 84, 84d supply, fill #1
  Filled 2022-03-18: qty 84, 84d supply, fill #2
  Filled 2022-05-16 (×3): qty 28, 28d supply, fill #3
  Filled 2022-06-06 – 2022-06-09 (×2): qty 84, 84d supply, fill #3

## 2021-08-31 NOTE — Patient Instructions (Signed)
I value your feedback and you entrusting us with your care. If you get a [facility] patient survey, I would appreciate you taking the time to let us know about your experience today. Thank you! ? ? ?

## 2021-09-03 LAB — CYTOLOGY - PAP
Comment: NEGATIVE
Diagnosis: NEGATIVE
High risk HPV: NEGATIVE

## 2021-09-29 ENCOUNTER — Other Ambulatory Visit: Payer: Self-pay

## 2021-09-30 ENCOUNTER — Other Ambulatory Visit: Payer: Self-pay

## 2021-10-19 ENCOUNTER — Other Ambulatory Visit: Payer: Self-pay

## 2021-10-29 ENCOUNTER — Other Ambulatory Visit: Payer: Self-pay

## 2021-10-29 ENCOUNTER — Encounter: Payer: Self-pay | Admitting: Internal Medicine

## 2021-10-29 ENCOUNTER — Ambulatory Visit (INDEPENDENT_AMBULATORY_CARE_PROVIDER_SITE_OTHER): Payer: No Typology Code available for payment source | Admitting: Internal Medicine

## 2021-10-29 VITALS — BP 102/68 | HR 87 | Temp 97.9°F | Ht 68.5 in | Wt 203.4 lb

## 2021-10-29 DIAGNOSIS — E611 Iron deficiency: Secondary | ICD-10-CM

## 2021-10-29 DIAGNOSIS — E039 Hypothyroidism, unspecified: Secondary | ICD-10-CM | POA: Diagnosis not present

## 2021-10-29 DIAGNOSIS — E785 Hyperlipidemia, unspecified: Secondary | ICD-10-CM

## 2021-10-29 DIAGNOSIS — E538 Deficiency of other specified B group vitamins: Secondary | ICD-10-CM

## 2021-10-29 DIAGNOSIS — E669 Obesity, unspecified: Secondary | ICD-10-CM

## 2021-10-29 LAB — IBC + FERRITIN
Ferritin: 24.3 ng/mL (ref 10.0–291.0)
Iron: 65 ug/dL (ref 42–145)
Saturation Ratios: 14.2 % — ABNORMAL LOW (ref 20.0–50.0)
TIBC: 459.2 ug/dL — ABNORMAL HIGH (ref 250.0–450.0)
Transferrin: 328 mg/dL (ref 212.0–360.0)

## 2021-10-29 LAB — LIPID PANEL
Cholesterol: 248 mg/dL — ABNORMAL HIGH (ref 0–200)
HDL: 68.2 mg/dL (ref >39.00)
LDL Cholesterol: 156 mg/dL — ABNORMAL HIGH (ref 0–99)
NonHDL: 179.48
Total CHOL/HDL Ratio: 4
Triglycerides: 119 mg/dL (ref 0.0–149.0)
VLDL: 23.8 mg/dL (ref 0.0–40.0)

## 2021-10-29 LAB — VITAMIN B12: Vitamin B-12: 436 pg/mL (ref 211–911)

## 2021-10-29 LAB — TSH: TSH: 1.19 u[IU]/mL (ref 0.35–5.50)

## 2021-10-29 MED ORDER — PHENTERMINE HCL 37.5 MG PO TABS
ORAL_TABLET | Freq: Every day | ORAL | 0 refills | Status: DC
  Filled 2021-10-29: qty 60, 60d supply, fill #0

## 2021-10-29 MED ORDER — PHENTERMINE HCL 37.5 MG PO TABS
ORAL_TABLET | Freq: Every day | ORAL | 0 refills | Status: DC
Start: 2021-10-29 — End: 2022-04-11
  Filled 2021-10-29: qty 60, fill #0
  Filled 2021-12-23 – 2021-12-27 (×3): qty 60, 60d supply, fill #0

## 2021-10-29 NOTE — Patient Instructions (Signed)
General Headache Without Cause °A headache is pain or discomfort felt around the head or neck area. There are many causes and types of headaches. A few common types include: °Tension headaches. °Migraine headaches. °Cluster headaches. °Chronic daily headaches. °Sometimes, the specific cause of a headache may not be found. °Follow these instructions at home: °Watch your condition for any changes. Let your health care provider know about them. Take these steps to help with your condition: °Managing pain °  °Take over-the-counter and prescription medicines only as told by your health care provider. Treatment may include medicines for pain that are taken by mouth or applied to the skin. °Lie down in a dark, quiet room when you have a headache. °Keep lights dim if bright lights bother you or make your headaches worse. °If directed, put ice on your head and neck area: °Put ice in a plastic bag. °Place a towel between your skin and the bag. °Leave the ice on for 20 minutes, 2-3 times per day. °Remove the ice if your skin turns bright red. This is very important. If you cannot feel pain, heat, or cold, you have a greater risk of damage to the area. °If directed, apply heat to the affected area. Use the heat source that your health care provider recommends, such as a moist heat pack or a heating pad. °Place a towel between your skin and the heat source. °Leave the heat on for 20-30 minutes. °Remove the heat if your skin turns bright red. This is especially important if you are unable to feel pain, heat, or cold. You have a greater risk of getting burned. °Eating and drinking °Eat meals on a regular schedule. °If you drink alcohol: °Limit how much you have to: °0-1 drink a day for women who are not pregnant. °0-2 drinks a day for men. °Know how much alcohol is in a drink. In the [place] one drink equals one 12 oz bottle of beer (355 mL), one 5 oz glass of wine (148 mL), or one 1½ oz glass of hard liquor (44 mL). °Stop drinking  caffeine, or decrease the amount of caffeine you drink. °Drink enough fluid to keep your urine pale yellow. °General instructions ° °Keep a headache journal to help find out what may trigger your headaches. For example, write down: °What you eat and drink. °How much sleep you get. °Any change to your diet or medicines. °Try massage or other relaxation techniques. °Limit stress. °Sit up straight, and do not tense your muscles. °Do not use any products that contain nicotine or tobacco. These products include cigarettes, chewing tobacco, and vaping devices, such as e-cigarettes. If you need help quitting, ask your health care provider. °Exercise regularly as told by your health care provider. °Sleep on a regular schedule. Get 7-9 hours of sleep each night, or the amount recommended by your health care provider. °Keep all follow-up visits. This is important. °Contact a health care provider if: °Medicine does not help your symptoms. °You have a headache that is different from your usual headache. °You have nausea or you vomit. °You have a fever. °Get help right away if: °Your headache: °Becomes severe quickly. °Gets worse after moderate to intense physical activity. °You have any of these symptoms: °Repeated vomiting. °Pain or stiffness in your neck. °Changes to your vision. °Pain in an eye or ear. °Problems with speech. °Muscular weakness or loss of muscle control. °Loss of balance or coordination. °You feel faint or pass out. °You have confusion. °You have a seizure. °  These symptoms may represent a serious problem that is an emergency. Do not wait to see if the symptoms will go away. Get medical help right away. Call your local emergency services (911 in the [place] Do not drive yourself to the hospital. °Summary °A headache is pain or discomfort felt around the head or neck area. °There are many causes and types of headaches. In some cases, the cause may not be found. °Keep a headache journal to help find out what may  trigger your headaches. Watch your condition for any changes. Let your health care provider know about them. °Contact a health care provider if you have a headache that is different from the usual headache, or if your symptoms are not helped by medicine. °Get help right away if your headache becomes severe, you vomit, you have a loss of vision, you lose your balance, or you have a seizure. °This information is not intended to replace advice given to you by your health care provider. Make sure you discuss any questions you have with your health care provider. °Document Revised: 04/14/2021 Document Reviewed: 04/14/2021 °Elsevier Patient Education © 2022 [employer]. ° °

## 2021-10-29 NOTE — Progress Notes (Signed)
Chief Complaint  Patient presents with   Follow-up   F/u  1. 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 Grief due to grandfather dying 08/2021  3.obesity gained weight sinc e#2 wants to get back on adipex  4.B12 def energy better since B12 Q30 days 03/2021    Review of Systems  Constitutional:  Negative for weight loss.  HENT:  Negative for hearing loss.   Eyes:  Negative for blurred vision.  Respiratory:  Negative for shortness of breath.   Cardiovascular:  Negative for chest pain.  Gastrointestinal:  Negative for abdominal pain and blood in stool.  Genitourinary:  Negative for dysuria.  Musculoskeletal:  Negative for falls and joint pain.  Skin:  Negative for rash.  Neurological:  Negative for headaches.  Psychiatric/Behavioral:  Negative for depression.   Past Medical History:  Diagnosis Date   Anxiety    COVID-19    10/2019 11/2020   Depression    Hypothyroidism    Iron deficiency    Obesity    Thyroid disease    Past Surgical History:  Procedure Laterality Date   CESAREAN SECTION  07/2008   CESAREAN SECTION     Family History  Problem Relation Age of Onset   Hyperlipidemia Mother    Depression Mother    Hypertension Mother    Hypothyroidism Mother    Thyroid disease Mother    Hypertension Father    Diabetes Father    Asthma Father    Heart disease Father    Hyperlipidemia Father    Lupus Sister    Hypothyroidism Sister        hashimotos thyroid removal for precancerous cells   Asthma Sister    Miscarriages / Stillbirths Sister    Depression Son    Heart disease Maternal Grandmother    Dementia Maternal Grandmother    Thyroid disease Maternal Grandmother    Thyroid disease Maternal Grandfather    Colon cancer Maternal Grandfather 43   Melanoma Maternal Grandfather 69   Cancer Paternal Grandfather        skin    Social History   Socioeconomic History   Marital status: Married    Spouse name: Not on file   Number of children: 1   Years of  education: Not on file   Highest education level: Not on file  Occupational History   Not on file  Tobacco Use   Smoking status: Former   Smokeless tobacco: Never   Tobacco comments:    quit smoking 2009   Vaping Use   Vaping Use: Never used  Substance and Sexual Activity   Alcohol use: Yes   Drug use: No   Sexual activity: Yes    Birth control/protection: Pill  Other Topics Concern   Not on file  Social History Narrative   Wears seat belt   Feels safe in relationship    3 kids 2 bio and 1 step son    Therapist, 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   busPIRone (BUSPAR) 10 MG tablet TAKE 1 TABLET BY MOUTH  2 TIMES DAILY.   cyanocobalamin (,VITAMIN B-12,) 1000 MCG/ML injection Inject 1 mL (1,000 mcg total) into the muscle every 30 (thirty) days.   drospirenone-ethinyl estradiol (YAZ) 3-0.02 MG tablet TAKE 1 TABLET BY MOUTH DAILY. (USE NDC# [phone])   levothyroxine (SYNTHROID) 112 MCG tablet TAKE 1 TABLET DAILY IN MORNING AND 1 & 1/2 TABLETS ON WEDNESDAYS   Multiple Vitamins-Minerals (MULTIPLE VITAMINS/WOMENS PO) Take by mouth.   NEEDLE, DISP, 25 G 25G X 1-1/2" MISC 1 Device by Does not apply route every 30 (thirty) days.   tiZANidine (ZANAFLEX) 2 MG tablet Take 1 tablet (2 mg total) by mouth at bedtime as needed for muscle spasms.   triamcinolone cream (KENALOG) 0.1 % Apply twice daily to bites as needed.   VITAMIN D PO Take by mouth.   No Known Allergies Recent Results (from the past 2160 hour(s))  Cytology - PAP     Status: None   Collection Time: 08/31/21 11:19 AM  Result Value Ref Range   High  risk HPV Negative    Adequacy      Satisfactory for evaluation; transformation zone component PRESENT.   Diagnosis      - Negative for intraepithelial lesion or malignancy (NILM)   Comment Normal Reference Range HPV - Negative    Objective  Body mass index is 30.48 kg/m. Wt Readings from Last 3 Encounters:  10/29/21 203 lb 6.4 oz (92.3 kg)  08/31/21 204 lb (92.5 kg)  04/16/21 221 lb 3.2 oz (100.3 kg)   Temp Readings from Last 3 Encounters:  10/29/21 97.9 F (36.6 C) (Oral)  04/16/21 98.1 F (36.7 C) (Oral)  10/13/20 98.1 F (36.7 C) (Oral)   BP Readings from Last 3 Encounters:  10/29/21 102/68  08/31/21 112/76  04/16/21 106/70   Pulse Readings from Last 3 Encounters:  10/29/21 87  04/16/21 79  10/13/20 81    Physical Exam Vitals and nursing note reviewed.  Constitutional:      Appearance: Normal appearance. She is well-developed and well-groomed.  HENT:     Head: Normocephalic and atraumatic.  Eyes:     Conjunctiva/sclera: Conjunctivae normal.     Pupils: Pupils are equal, round, and reactive to light.  Cardiovascular:     Rate and Rhythm: Normal rate and regular rhythm.     Heart sounds: Normal heart sounds. No murmur heard. Pulmonary:     Effort: Pulmonary effort is normal.     Breath sounds: Normal breath sounds.  Abdominal:     General: Abdomen is flat. Bowel sounds are normal.     Tenderness: There is no abdominal tenderness.  Musculoskeletal:        General: No tenderness.  Skin:    General: Skin is warm and dry.  Neurological:     General: No focal deficit present.     Mental Status: She is alert and oriented to person, place, and time. Mental status is at baseline.     Cranial Nerves: Cranial nerves 2-12 are intact.     Gait: Gait is intact.  Psychiatric:        Attention and Perception: Attention and perception normal.        Mood and Affect: Mood and affect normal.        Speech: Speech normal.        Behavior: Behavior normal. Behavior is  cooperative.        Thought Content: Thought content normal.        Cognition and Memory: Cognition and memory normal.  Judgment: Judgment normal.    Assessment  Plan  Hypothyroidism, unspecified type - Plan: TSH, Thyroid peroxidase antibody Levo  TAKE 1 TABLET DAILY IN MORNING AND 1 & 1/2 TABLETS ON WEDNESDAYS  B12 deficiency - Plan: Vitamin B12 q30 days  Hyperlipidemia, unspecified hyperlipidemia type - Plan: Lipid panel  Iron deficiency - Plan: IBC + Ferritin,   Obesity (BMI 30-39.9) - Plan: phentermine (ADIPEX-P) 37.5 MG tablet, phentermine (ADIPEX-P) 37.5 MG tablet   HM Flu shot utd 2022 red rash Tdap in 2017  MMR immune Pfizer 2/2 declines Had HPV vaccine  Pap [facility] 04/12/18 neg pap neg hpv disc irregular menses with alicia copeland   -normal cycles as of 09/2020 ocp was being changed different generics with pharmacies  08/31/21 neg neg HPV   Colonoscopy 60  Mammogram 35 y.o no FH Skin appt [facility] 05/2019 normal f/u 05/2021 nl watching right upper lip everything ok  rec healthy diet and exercise  eye MD seen 07/2020     Provider: Dr. Olivia Mackie McLean-Scocuzza-Internal Medicine

## 2021-11-01 ENCOUNTER — Other Ambulatory Visit: Payer: Self-pay

## 2021-11-01 LAB — THYROID PEROXIDASE ANTIBODY: Thyroperoxidase Ab SerPl-aCnc: 109 IU/mL — ABNORMAL HIGH (ref <9)

## 2021-11-04 ENCOUNTER — Encounter: Payer: Self-pay | Admitting: Internal Medicine

## 2021-11-04 NOTE — Telephone Encounter (Signed)
-----   Message from Bevelyn Buckles, MD sent at 11/03/2021  5:52 PM EST ----- I think I would consult at least 1-2 x to see what cardiology says D.r Hilty given cholesterol #s  Do you agree to referral?

## 2021-11-04 NOTE — Addendum Note (Signed)
Addended by: Tilford Pillar on: 11/04/2021 07:28 AM   Modules accepted: Orders

## 2021-11-04 NOTE — Addendum Note (Signed)
Addended by: Quentin Ore on: 11/04/2021 08:58 AM   Modules accepted: Orders

## 2021-11-04 NOTE — Telephone Encounter (Signed)
Referral placed inside last office visit and sent to provider to [facility]

## 2021-12-23 ENCOUNTER — Other Ambulatory Visit: Payer: Self-pay

## 2021-12-27 ENCOUNTER — Other Ambulatory Visit: Payer: Self-pay

## 2022-02-01 ENCOUNTER — Other Ambulatory Visit: Payer: Self-pay

## 2022-03-18 ENCOUNTER — Other Ambulatory Visit: Payer: Self-pay

## 2022-03-29 ENCOUNTER — Encounter: Payer: Self-pay | Admitting: Pharmacist

## 2022-03-29 ENCOUNTER — Other Ambulatory Visit: Payer: Self-pay

## 2022-03-30 ENCOUNTER — Other Ambulatory Visit: Payer: Self-pay

## 2022-04-07 ENCOUNTER — Other Ambulatory Visit: Payer: Self-pay

## 2022-04-11 ENCOUNTER — Encounter: Payer: Self-pay | Admitting: Internal Medicine

## 2022-04-11 ENCOUNTER — Ambulatory Visit (INDEPENDENT_AMBULATORY_CARE_PROVIDER_SITE_OTHER): Payer: No Typology Code available for payment source | Admitting: Internal Medicine

## 2022-04-11 VITALS — BP 116/72 | HR 90 | Ht 68.08 in | Wt 210.0 lb

## 2022-04-11 DIAGNOSIS — E063 Autoimmune thyroiditis: Secondary | ICD-10-CM

## 2022-04-11 DIAGNOSIS — E785 Hyperlipidemia, unspecified: Secondary | ICD-10-CM

## 2022-04-11 DIAGNOSIS — Z8249 Family history of ischemic heart disease and other diseases of the circulatory system: Secondary | ICD-10-CM | POA: Diagnosis not present

## 2022-04-11 DIAGNOSIS — Z7189 Other specified counseling: Secondary | ICD-10-CM | POA: Diagnosis not present

## 2022-04-11 NOTE — Patient Instructions (Signed)
Medication Instructions:  ?NO CHANGES ? ?*If you need a refill on your cardiac medications before your next appointment, please call your pharmacy* ? ? ?Lab Work: ?FASTING NMR lipoprofile, [facility] in June  ? ?If you have labs (blood work) drawn today and your tests are completely normal, you will receive your results only by: ?MyChart Message (if you have MyChart) OR ?A paper copy in the mail ?If you have any lab test that is abnormal or we need to change your treatment, we will call you to review the results. ? ? ?Testing/Procedures: ?NONE ? ? ?Follow-Up: ?At [facility] you and your health needs are our priority.  As part of our continuing mission to provide you with exceptional heart care, we have created designated Provider Care Teams.  These Care Teams include your primary Cardiologist (physician) and Advanced Practice Providers (APPs -  Physician Assistants and Nurse Practitioners) who all work together to provide you with the care you need, when you need it. ? ?We recommend signing up for the patient portal called "MyChart".  Sign up information is provided on this After Visit Summary.  MyChart is used to connect with patients for Virtual Visits (Telemedicine).  Patients are able to view lab/test results, encounter notes, upcoming appointments, etc.  Non-urgent messages can be sent to your provider as well.   ?To learn more about what you can do with MyChart, go to [email or web address].   ? ?Your next appointment:   ?June 2023 (after labs) with Dr. Rennis Golden -- virtual/video visit ?

## 2022-04-11 NOTE — Progress Notes (Signed)
? ? ?LIPID CLINIC CONSULT NOTE ? ?Chief Complaint:  ?Manage dyslipidemia ? ?Primary Care Physician: ?McLean-Scocuzza, Pasty Spillersracy N, MD ? ?Primary Cardiologist:  ?None ? ?HPI:  ?Kaitlyn Dalton is a 36 y.o. female who is being seen today for the evaluation of dyslipidemia at the request of McLean-Scocuzza, French Anaracy *. This is a pleasant 36 year old female who works for [employer] and is a Engineer, civil (consulting)nurse by training.  She has a history of Hashimoto's thyroiditis which has been well managed on levothyroxine.  Otherwise she takes a few additional medications and has few traditional risk factors other than a family history of early onset heart disease.  In fact both of her parents had early heart disease with her father having had coronary stents in his 1250s and is in her mother with a stent in her 4060s.  Her maternal grandfather also had congestive heart failure symptoms.  She reports some longstanding history of dyslipidemia with moderately elevated cholesterol.  Total of 248 recently, triglycerides 119, HDL 68 and LDL 156.  This very somewhat with diet and activity levels but in general has not ever been much lower than 200 for total cholesterol.  She has not been on any therapy for this.  She was referred today for recommendations regarding management.  She reports a varied diet but is fairly healthy with low in saturated fat.  She says she exercises fairly regularly but does have 3 children at home.  She is asymptomatic denying chest pain or worsening shortness of breath. ? ?PMHx:  ?Past Medical History:  ?Diagnosis Date  ? Anxiety   ? COVID-19   ? 10/2019 11/2020  ? Depression   ? Hypothyroidism   ? Iron deficiency   ? Obesity   ? Thyroid disease   ? ? ?Past Surgical History:  ?Procedure Laterality Date  ? CESAREAN SECTION  07/2008  ? CESAREAN SECTION    ? ? ?FAMHx:  ?Family History  ?Problem Relation Age of Onset  ? Hyperlipidemia Mother   ? Depression Mother   ? Hypertension Mother   ? Hypothyroidism Mother   ? Thyroid disease Mother    ? Hypertension Father   ? Diabetes Father   ? Asthma Father   ? Heart disease Father   ? Hyperlipidemia Father   ? Lupus Sister   ? Hypothyroidism Sister   ?     hashimotos thyroid removal for precancerous cells  ? Asthma Sister   ? Miscarriages / Stillbirths Sister   ? Depression Son   ? Heart disease Maternal Grandmother   ? Dementia Maternal Grandmother   ? Thyroid disease Maternal Grandmother   ? Thyroid disease Maternal Grandfather   ? Colon cancer Maternal Grandfather [id number]  ? Melanoma Maternal Grandfather [id number]  ? Cancer Paternal Grandfather   ?     skin   ? ? ?SOCHx:  ? reports that she has quit smoking. She has never used smokeless tobacco. She reports current alcohol use. She reports that she does not use drugs. ? ?ALLERGIES:  ?No Known Allergies ? ?ROS: ?Pertinent items noted in HPI and remainder of comprehensive ROS otherwise negative. ? ?HOME MEDS: ?Current Outpatient Medications on File Prior to Visit  ?Medication Sig Dispense Refill  ? buPROPion (WELLBUTRIN XL) 300 MG 24 hr tablet TAKE 1 TABLET BY MOUTH EVERY MORNING. 90 tablet 3  ? busPIRone (BUSPAR) 10 MG tablet TAKE 1 TABLET BY MOUTH 2 TIMES DAILY. 180 tablet 3  ? cyanocobalamin (,VITAMIN B-12,) 1000 MCG/ML injection Inject 1 mL (1,000 mcg total)  into the muscle every 30 (thirty) days. 12 mL 1  ? drospirenone-ethinyl estradiol (YAZ) 3-0.02 MG tablet TAKE 1 TABLET BY MOUTH DAILY. (USE NDC# [phone]) 84 tablet 3  ? levothyroxine (SYNTHROID) 112 MCG tablet TAKE 1 TABLET DAILY IN MORNING AND 1 & 1/2 TABLETS ON WEDNESDAYS 100 tablet 3  ? Multiple Vitamins-Minerals (MULTIPLE VITAMINS/WOMENS PO) Take by mouth.    ? NEEDLE, DISP, 25 G 25G X 1-1/2" MISC 1 Device by Does not apply route every 30 (thirty) days. 12 each 1  ? tiZANidine (ZANAFLEX) 2 MG tablet Take 1 tablet (2 mg total) by mouth at bedtime as needed for muscle spasms. 30 tablet 2  ? triamcinolone cream (KENALOG) 0.1 % Apply twice daily to bites as needed. 45 g 1  ? VITAMIN D PO Take 200 Int'l  Units/day by mouth.    ? ?No current facility-administered medications on file prior to visit.  ? ? ?LABS/IMAGING: ?No results found for this or any previous visit (from the past 48 hour(s)). ?No results found. ? ?LIPID PANEL: ?   ?Component Value Date/Time  ? CHOL 248 (H) 10/29/2021 0905  ? CHOL 218 (H) 03/25/2019 0827  ? TRIG 119.0 10/29/2021 0905  ? HDL 68.20 10/29/2021 0905  ? HDL 57 03/25/2019 0827  ? CHOLHDL 4 10/29/2021 0905  ? VLDL 23.8 10/29/2021 0905  ? LDLCALC 156 (H) 10/29/2021 4665  ? LDLCALC 143 (H) 03/25/2019 0827  ? ? ?WEIGHTS: ?Wt Readings from Last 3 Encounters:  ?04/11/22 210 lb (95.3 kg)  ?10/29/21 203 lb 6.4 oz (92.3 kg)  ?08/31/21 204 lb (92.5 kg)  ? ? ?VITALS: ?BP 116/72   Pulse 90   Ht 5' 8.08" (1.729 m)   Wt 210 lb (95.3 kg)   SpO2 99%   BMI 31.86 kg/m?  ? ?EXAM: ?Deferred ? ?EKG: ?Deferred ? ?ASSESSMENT: ?Mixed dyslipidemia, goal LDL less than 100 ?Intermediate coronary risk due to strong family history of premature heart disease in both parents ?Hashimoto's thyroiditis ? ?PLAN: ?1.   Kaitlyn Dalton has moderately elevated cholesterol which has never really normalized with diet.  I suspect there is a genetic reason for her increased cholesterol specially with early onset heart disease in both parents.  Ultimately she will likely need therapy but for the time being she like to continue to work on diet and lifestyle modifications.  I told her she could consider over-the-counter plant 5 to sterile such as CholestOff.  She tried red yeast rice before with no benefit.  There really is little data to support the use of fish oil for primary prevention or coenzyme Q 10 in my opinion.  We discussed the possibility of a calcium score although it is a little early to consider this.  She may not have any calcification due to the fact that she is quite young.  I would consider that probably at age 45 or later.  It would be helpful to look at a more detailed lipid profile.  I will order lipid NMR and  LP(a) as this could be genetically inherited.  She plans to have blood work with her PCP in the summer and can have the labs done at that time.  I will review it and we can follow-up virtually afterwards. ? ?Thanks again for the kind referral. ? ?Chrystie Nose, MD, [facility], FACP  ?[place]  [facility]  ?Medical Director of the [facility] &  ?Cardiovascular Risk Reduction Clinic ?Diplomate of the [employer] of Clinical Lipidology ?Attending Cardiologist  ?  Direct Dial: [phone]  Fax: [phone]  ?Website:  [email or web address] ? ?Kaitlyn Dalton ?04/11/2022, 9:58 AM ?

## 2022-04-29 ENCOUNTER — Encounter: Payer: Self-pay | Admitting: Internal Medicine

## 2022-04-29 ENCOUNTER — Ambulatory Visit (INDEPENDENT_AMBULATORY_CARE_PROVIDER_SITE_OTHER): Payer: No Typology Code available for payment source | Admitting: Internal Medicine

## 2022-04-29 ENCOUNTER — Other Ambulatory Visit: Payer: Self-pay

## 2022-04-29 VITALS — BP 104/70 | HR 75 | Temp 97.5°F | Resp 14 | Ht 68.08 in | Wt 212.2 lb

## 2022-04-29 DIAGNOSIS — Z0000 Encounter for general adult medical examination without abnormal findings: Secondary | ICD-10-CM | POA: Diagnosis not present

## 2022-04-29 DIAGNOSIS — E039 Hypothyroidism, unspecified: Secondary | ICD-10-CM

## 2022-04-29 DIAGNOSIS — E063 Autoimmune thyroiditis: Secondary | ICD-10-CM

## 2022-04-29 DIAGNOSIS — E611 Iron deficiency: Secondary | ICD-10-CM

## 2022-04-29 DIAGNOSIS — Z6832 Body mass index (BMI) 32.0-32.9, adult: Secondary | ICD-10-CM

## 2022-04-29 DIAGNOSIS — E538 Deficiency of other specified B group vitamins: Secondary | ICD-10-CM | POA: Diagnosis not present

## 2022-04-29 DIAGNOSIS — E785 Hyperlipidemia, unspecified: Secondary | ICD-10-CM

## 2022-04-29 DIAGNOSIS — E559 Vitamin D deficiency, unspecified: Secondary | ICD-10-CM | POA: Diagnosis not present

## 2022-04-29 DIAGNOSIS — F419 Anxiety disorder, unspecified: Secondary | ICD-10-CM

## 2022-04-29 DIAGNOSIS — Z1389 Encounter for screening for other disorder: Secondary | ICD-10-CM

## 2022-04-29 DIAGNOSIS — L309 Dermatitis, unspecified: Secondary | ICD-10-CM

## 2022-04-29 DIAGNOSIS — F3289 Other specified depressive episodes: Secondary | ICD-10-CM

## 2022-04-29 HISTORY — DX: Autoimmune thyroiditis: E06.3

## 2022-04-29 LAB — CBC WITH DIFFERENTIAL/PLATELET
Basophils Absolute: 0.1 10*3/uL (ref 0.0–0.1)
Basophils Relative: 0.9 % (ref 0.0–3.0)
Eosinophils Absolute: 0.1 10*3/uL (ref 0.0–0.7)
Eosinophils Relative: 1.7 % (ref 0.0–5.0)
HCT: 38.8 % (ref 36.0–46.0)
Hemoglobin: 12.8 g/dL (ref 12.0–15.0)
Lymphocytes Relative: 30.6 % (ref 12.0–46.0)
Lymphs Abs: 2 10*3/uL (ref 0.7–4.0)
MCHC: 32.9 g/dL (ref 30.0–36.0)
MCV: 87.5 fl (ref 78.0–100.0)
Monocytes Absolute: 0.4 10*3/uL (ref 0.1–1.0)
Monocytes Relative: 6.7 % (ref 3.0–12.0)
Neutro Abs: 4 10*3/uL (ref 1.4–7.7)
Neutrophils Relative %: 60.1 % (ref 43.0–77.0)
Platelets: 291 10*3/uL (ref 150.0–400.0)
RBC: 4.44 Mil/uL (ref 3.87–5.11)
RDW: 12.8 % (ref 11.5–15.5)
WBC: 6.6 10*3/uL (ref 4.0–10.5)

## 2022-04-29 LAB — COMPREHENSIVE METABOLIC PANEL
ALT: 20 U/L (ref 0–35)
AST: 15 U/L (ref 0–37)
Albumin: 4.2 g/dL (ref 3.5–5.2)
Alkaline Phosphatase: 81 U/L (ref 39–117)
BUN: 11 mg/dL (ref 6–23)
CO2: 28 mEq/L (ref 19–32)
Calcium: 9.5 mg/dL (ref 8.4–10.5)
Chloride: 102 mEq/L (ref 96–112)
Creatinine, Ser: 0.83 mg/dL (ref 0.40–1.20)
GFR: 91.48 mL/min (ref >60.00)
Glucose, Bld: 79 mg/dL (ref 70–99)
Potassium: 4 mEq/L (ref 3.5–5.1)
Sodium: 137 mEq/L (ref 135–145)
Total Bilirubin: 0.6 mg/dL (ref 0.2–1.2)
Total Protein: 7.1 g/dL (ref 6.0–8.3)

## 2022-04-29 LAB — VITAMIN B12: Vitamin B-12: 392 pg/mL (ref 211–911)

## 2022-04-29 LAB — TSH: TSH: 1.44 u[IU]/mL (ref 0.35–5.50)

## 2022-04-29 LAB — IBC + FERRITIN
Ferritin: 19.9 ng/mL (ref 10.0–291.0)
Iron: 103 ug/dL (ref 42–145)
Saturation Ratios: 21.8 % (ref 20.0–50.0)
TIBC: 471.8 ug/dL — ABNORMAL HIGH (ref 250.0–450.0)
Transferrin: 337 mg/dL (ref 212.0–360.0)

## 2022-04-29 LAB — VITAMIN D 25 HYDROXY (VIT D DEFICIENCY, FRACTURES): VITD: 31.06 ng/mL (ref 30.00–100.00)

## 2022-04-29 LAB — T4, FREE: Free T4: 1.05 ng/dL (ref 0.60–1.60)

## 2022-04-29 MED ORDER — BUSPIRONE HCL 10 MG PO TABS
ORAL_TABLET | Freq: Two times a day (BID) | ORAL | 3 refills | Status: DC
  Filled 2022-04-29: qty 180, fill #0
  Filled 2022-08-12: qty 180, 90d supply, fill #0
  Filled 2023-01-10: qty 180, 90d supply, fill #1

## 2022-04-29 MED ORDER — PHENTERMINE HCL 37.5 MG PO TABS
37.5000 mg | ORAL_TABLET | Freq: Every day | ORAL | 0 refills | Status: DC
  Filled 2022-04-29: qty 60, 60d supply, fill #0

## 2022-04-29 MED ORDER — LEVOTHYROXINE SODIUM 112 MCG PO TABS
ORAL_TABLET | ORAL | 3 refills | Status: DC
  Filled 2022-04-29: qty 90, 90d supply, fill #0
  Filled 2022-05-10: qty 100, 90d supply, fill #0
  Filled 2022-08-12: qty 100, 90d supply, fill #1
  Filled 2022-11-09: qty 100, 90d supply, fill #2
  Filled 2023-02-13: qty 100, 90d supply, fill #3

## 2022-04-29 MED ORDER — PHENTERMINE HCL 37.5 MG PO TABS
37.5000 mg | ORAL_TABLET | Freq: Every day | ORAL | 0 refills | Status: DC
  Filled 2022-04-29 – 2022-06-23 (×2): qty 60, 60d supply, fill #0

## 2022-04-29 MED ORDER — CYANOCOBALAMIN 1000 MCG/ML IJ SOLN
1000.0000 ug | INTRAMUSCULAR | 1 refills | Status: DC
  Filled 2022-04-29: qty 3, 90d supply, fill #0
  Filled 2022-08-12: qty 3, 90d supply, fill #1
  Filled 2022-11-09: qty 3, 90d supply, fill #2
  Filled 2023-02-13: qty 3, 90d supply, fill #3
  Filled 2023-04-06: qty 3, 90d supply, fill #4

## 2022-04-29 MED ORDER — BUPROPION HCL ER (XL) 300 MG PO TB24
ORAL_TABLET | Freq: Every morning | ORAL | 3 refills | Status: DC
  Filled 2022-04-29: qty 90, fill #0
  Filled 2022-06-23: qty 90, 90d supply, fill #0
  Filled 2022-10-14: qty 90, 90d supply, fill #1
  Filled 2023-01-10: qty 90, 90d supply, fill #2
  Filled 2023-04-06: qty 90, 90d supply, fill #3

## 2022-04-29 MED ORDER — TRIAMCINOLONE ACETONIDE 0.1 % EX CREA
TOPICAL_CREAM | CUTANEOUS | 3 refills | Status: AC
  Filled 2022-04-29: qty 45, 30d supply, fill #0

## 2022-04-29 MED ORDER — "NEEDLE (DISP) 25G X 1-1/2"" MISC"
1.0000 | 1 refills | Status: DC
  Filled 2022-04-29: qty 12, fill #0

## 2022-04-29 NOTE — Progress Notes (Signed)
Chief Complaint  Patient presents with   Annual Exam    Fasting for labs, would like labs from Dr.Hilty's office ordered on 04/11/22 drawn today as well. Denies any pain   Annual doing well 1. Hashimotos on levo 112 and 1.5 tablets qd  2. Hld wants Dr. Debara Pickett labs  3. Obesity wants refill of adipex  4. Bug bites to right back and right ankle wants [facility] refilled  Review of Systems  Constitutional:  Negative for weight loss.  HENT:  Negative for hearing loss.   Eyes:  Negative for blurred vision.  Respiratory:  Negative for shortness of breath.   Cardiovascular:  Negative for chest pain.  Gastrointestinal:  Negative for abdominal pain and blood in stool.  Genitourinary:  Negative for dysuria.  Musculoskeletal:  Negative for falls and joint pain.  Skin:  Positive for itching and rash.  Neurological:  Negative for headaches.  Psychiatric/Behavioral:  Negative for depression.   Past Medical History:  Diagnosis Date   Anxiety    COVID-19    10/2019 11/2020   Depression    Hypothyroidism    Iron deficiency    Obesity    Thyroid disease    Past Surgical History:  Procedure Laterality Date   CESAREAN SECTION  07/2008   CESAREAN SECTION     Family History  Problem Relation Age of Onset   Hyperlipidemia Mother    Depression Mother    Hypertension Mother    Hypothyroidism Mother    Thyroid disease Mother    Hypertension Father    Diabetes Father    Asthma Father    Heart disease Father    Hyperlipidemia Father    Lupus Sister    Hypothyroidism Sister        hashimotos thyroid removal for precancerous cells   Asthma Sister    Miscarriages / Stillbirths Sister    Depression Son    Heart disease Maternal Grandmother    Dementia Maternal Grandmother    Thyroid disease Maternal Grandmother    Thyroid disease Maternal Grandfather    Colon cancer Maternal Grandfather 84   Melanoma Maternal Grandfather 80   Cancer Paternal Grandfather        skin    Social History    Socioeconomic History   Marital status: Married    Spouse name: Not on file   Number of children: 1   Years of education: Not on file   Highest education level: Not on file  Occupational History   Not on file  Tobacco Use   Smoking status: Former   Smokeless tobacco: Never   Tobacco comments:    quit smoking 2009   Vaping Use   Vaping Use: Never used  Substance and Sexual Activity   Alcohol use: Yes   Drug use: No   Sexual activity: Yes    Birth control/protection: Pill  Other Topics Concern   Not on file  Social History Narrative   Wears seat belt   Feels safe in relationship    3 kids 2 bio and 1 step son    Therapist, 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 (USE NDC# [phone])   Multiple Vitamins-Minerals (MULTIPLE VITAMINS/WOMENS PO) Take by mouth.   phentermine (ADIPEX-P) 37.5 MG tablet Take 1 tablet (37.5 mg total) by mouth daily before breakfast. 1/2   phentermine (ADIPEX-P) 37.5 MG tablet Take 1 tablet (37.5 mg total) by mouth daily before breakfast. 2/2   tiZANidine (ZANAFLEX) 2 MG tablet Take 1 tablet (2 mg total) by mouth at bedtime as needed for muscle spasms.   VITAMIN D PO Take 200 Int'l Units/day by mouth.   [DISCONTINUED] buPROPion (WELLBUTRIN XL) 300 MG 24 hr tablet TAKE 1 TABLET BY MOUTH EVERY MORNING.   [DISCONTINUED] busPIRone (BUSPAR) 10 MG tablet TAKE 1 TABLET BY MOUTH 2 TIMES DAILY.   [DISCONTINUED] cyanocobalamin (,VITAMIN B-12,) 1000 MCG/ML injection Inject 1 mL (1,000 mcg total) into the muscle every 30 (thirty) days.   [DISCONTINUED] levothyroxine (SYNTHROID) 112 MCG tablet  TAKE 1 TABLET DAILY IN MORNING AND 1 & 1/2 TABLETS ON WEDNESDAYS   [DISCONTINUED] NEEDLE, DISP, 25 G 25G X 1-1/2" MISC 1 Device by Does not apply route every 30 (thirty) days.   [DISCONTINUED] triamcinolone cream (KENALOG) 0.1 % Apply twice daily to bites as needed.   No Known Allergies No results found for this or any previous visit (from the past 2160 hour(s)). Objective  Body mass index is 32.19 kg/m. Wt Readings from Last 3 Encounters:  04/29/22 212 lb 3.2 oz (96.3 kg)  04/11/22 210 lb (95.3 kg)  10/29/21 203 lb 6.4 oz (92.3 kg)   Temp Readings from Last 3 Encounters:  04/29/22 (!) 97.5 F (36.4 C) (Oral)  10/29/21 97.9 F (36.6 C) (Oral)  04/16/21 98.1 F (36.7 C) (Oral)   BP Readings from Last 3 Encounters:  04/29/22 104/70  04/11/22 116/72  10/29/21 102/68   Pulse Readings from Last 3 Encounters:  04/29/22 75  04/11/22 90  10/29/21 87    Physical Exam Vitals and nursing note reviewed.  Constitutional:      Appearance: Normal appearance. She is well-developed and well-groomed.  HENT:     Head: Normocephalic and atraumatic.  Eyes:     Conjunctiva/sclera: Conjunctivae normal.     Pupils: Pupils are equal, round, and reactive to light.  Cardiovascular:     Rate and Rhythm: Normal rate and regular rhythm.     Heart sounds: Normal heart sounds. No murmur heard. Pulmonary:     Effort: Pulmonary effort is normal.     Breath sounds: Normal breath sounds.  Abdominal:     General: Abdomen is flat. Bowel sounds are normal.     Tenderness: There is no abdominal tenderness.  Musculoskeletal:        General: No tenderness.  Skin:    General: Skin is warm and dry.  Neurological:     General: No focal deficit present.     Mental Status: She is alert and oriented to person, place, and time. Mental status is at baseline.     Cranial Nerves: Cranial nerves 2-12 are intact.     Motor: Motor function is intact.     Coordination: Coordination is intact.     Gait: Gait is  intact.  Psychiatric:        Attention and Perception: Attention and perception normal.        Mood and Affect: Mood and affect normal.        Speech: Speech normal.        Behavior: Behavior normal. Behavior is cooperative.  Thought Content: Thought content normal.        Cognition and Memory: Cognition and memory normal.        Judgment: Judgment normal.    Assessment  Plan  Annual physical exam - Plan: Comprehensive metabolic panel, CBC with Differential/Platelet Wee below   Hyperlipidemia, unspecified hyperlipidemia type - Plan: NMR, lipoprofile, Lipoprotein A (LPA)  Hypothyroidism, unspecified type - Plan: TSH, Thyroid peroxidase antibody, T4, free, levothyroxine (SYNTHROID) 112 MCG tablet  Iron deficiency - Plan: IBC + Ferritin  B12 deficiency - Plan: Vitamin B12, cyanocobalamin (,VITAMIN B-12,) 1000 MCG/ML injection, NEEDLE, DISP, 25 G 25G X 1-1/2" MISC  Vitamin D deficiency - Plan: Vitamin D (25 hydroxy)  Hashimoto's thyroiditis - Plan: TSH, Thyroid peroxidase antibody, T4, free  Other depression  anxiety controlled- Plan: busPIRone (BUSPAR) 10 MG tablet, buPROPion (WELLBUTRIN XL) 300 MG 24 hr tablet  Anxiety - Plan: busPIRone (BUSPAR) 10 MG tablet  Dermatitis - Plan: triamcinolone cream (KENALOG) 0.1 %  BMI 32.0-32.9,adult - Plan: phentermine (ADIPEX-P) 37.5 MG tablet  Rec healthy diet and exercise   HM Flu shot utd 2022 red rash Tdap in 2017  MMR immune Pfizer 2/2 declines Had HPV vaccine  Pap [facility] 04/12/18 neg pap neg hpv disc irregular menses with alicia copeland   -normal cycles as of 09/2020 ocp was being changed different generics with pharmacies  08/31/21 neg neg HPV   Colonoscopy 43  Mammogram 36 y.o no FH Skin appt [facility] 05/2019 normal f/u 05/2021 nl watching right upper lip everything ok  rec healthy diet and exercise  eye MD seen 07/2020    Provider: Dr. Olivia Mackie McLean-Scocuzza-Internal Medicine

## 2022-04-30 LAB — URINALYSIS, ROUTINE W REFLEX MICROSCOPIC
Bacteria, UA: NONE SEEN /HPF
Bilirubin Urine: NEGATIVE
Glucose, UA: NEGATIVE
Hgb urine dipstick: NEGATIVE
Hyaline Cast: NONE SEEN /LPF
Ketones, ur: NEGATIVE
Nitrite: NEGATIVE
Protein, ur: NEGATIVE
RBC / HPF: NONE SEEN /HPF (ref 0–2)
Specific Gravity, Urine: 1.008 (ref 1.001–1.035)
pH: 7.5 (ref 5.0–8.0)

## 2022-04-30 LAB — MICROSCOPIC MESSAGE

## 2022-05-02 ENCOUNTER — Other Ambulatory Visit: Payer: Self-pay | Admitting: Internal Medicine

## 2022-05-02 DIAGNOSIS — B3731 Acute candidiasis of vulva and vagina: Secondary | ICD-10-CM

## 2022-05-02 LAB — THYROID PEROXIDASE ANTIBODY: Thyroperoxidase Ab SerPl-aCnc: 186 IU/mL — ABNORMAL HIGH (ref <9)

## 2022-05-02 MED ORDER — FLUCONAZOLE 150 MG PO TABS
150.0000 mg | ORAL_TABLET | Freq: Once | ORAL | 0 refills | Status: AC
  Filled 2022-05-02: qty 2, 3d supply, fill #0

## 2022-05-03 ENCOUNTER — Other Ambulatory Visit: Payer: Self-pay

## 2022-05-03 LAB — NMR, LIPOPROFILE
Cholesterol, Total: 235 mg/dL — ABNORMAL HIGH (ref 100–199)
HDL Particle Number: 53.5 umol/L (ref >=30.5)
HDL-C: 73 mg/dL (ref >39)
LDL Particle Number: 1804 nmol/L — ABNORMAL HIGH (ref <1000)
LDL Size: 21.4 nm (ref >20.5)
LDL-C (NIH Calc): 138 mg/dL — ABNORMAL HIGH (ref 0–99)
LP-IR Score: <25 (ref <=45)
Small LDL Particle Number: 851 nmol/L — ABNORMAL HIGH (ref <=527)
Triglycerides: 139 mg/dL (ref 0–149)

## 2022-05-03 LAB — LIPOPROTEIN A (LPA): Lipoprotein (a): 179.3 nmol/L — ABNORMAL HIGH (ref <75.0)

## 2022-05-06 ENCOUNTER — Other Ambulatory Visit: Payer: Self-pay

## 2022-05-10 ENCOUNTER — Other Ambulatory Visit: Payer: Self-pay | Admitting: *Deleted

## 2022-05-10 ENCOUNTER — Other Ambulatory Visit: Payer: Self-pay

## 2022-05-10 ENCOUNTER — Encounter: Payer: Self-pay | Admitting: Internal Medicine

## 2022-05-10 DIAGNOSIS — E785 Hyperlipidemia, unspecified: Secondary | ICD-10-CM

## 2022-05-10 MED ORDER — ROSUVASTATIN CALCIUM 5 MG PO TABS
5.0000 mg | ORAL_TABLET | Freq: Every day | ORAL | 3 refills | Status: DC
  Filled 2022-05-10: qty 90, 90d supply, fill #0
  Filled 2022-08-31: qty 90, 90d supply, fill #1
  Filled 2023-01-10: qty 90, 90d supply, fill #2
  Filled 2023-04-06: qty 90, 90d supply, fill #3

## 2022-05-16 ENCOUNTER — Other Ambulatory Visit: Payer: Self-pay

## 2022-05-20 ENCOUNTER — Telehealth: Payer: No Typology Code available for payment source | Admitting: Internal Medicine

## 2022-06-06 ENCOUNTER — Other Ambulatory Visit: Payer: Self-pay

## 2022-06-07 ENCOUNTER — Other Ambulatory Visit: Payer: Self-pay

## 2022-06-09 ENCOUNTER — Other Ambulatory Visit: Payer: Self-pay

## 2022-06-10 ENCOUNTER — Other Ambulatory Visit: Payer: Self-pay

## 2022-06-23 ENCOUNTER — Other Ambulatory Visit: Payer: Self-pay

## 2022-08-12 ENCOUNTER — Other Ambulatory Visit: Payer: Self-pay

## 2022-08-31 ENCOUNTER — Other Ambulatory Visit: Payer: Self-pay

## 2022-08-31 ENCOUNTER — Other Ambulatory Visit: Payer: Self-pay | Admitting: Obstetrics and Gynecology

## 2022-08-31 DIAGNOSIS — Z3041 Encounter for surveillance of contraceptive pills: Secondary | ICD-10-CM

## 2022-09-01 MED ORDER — DROSPIRENONE-ETHINYL ESTRADIOL 3-0.02 MG PO TABS
ORAL_TABLET | ORAL | 0 refills | Status: DC
  Filled 2022-09-01: qty 84, 84d supply, fill #0

## 2022-09-02 ENCOUNTER — Other Ambulatory Visit: Payer: Self-pay

## 2022-10-06 ENCOUNTER — Ambulatory Visit: Payer: No Typology Code available for payment source | Admitting: Obstetrics and Gynecology

## 2022-10-14 ENCOUNTER — Other Ambulatory Visit: Payer: Self-pay

## 2022-10-31 NOTE — Progress Notes (Unsigned)
PCP:  McLean-Scocuzza, Pasty Spillers, MD   No chief complaint on file.   HPI:      [name] is a 36 y.o. [phone] who LMP was No LMP recorded. (Menstrual status: Oral contraceptives)., presents today for her annual examination.  Her menses are infrequent on OCPs now, and occur only on placebo pills now, lasting 3-4 days, mod flow, mild dysmen. BTB resolved if pt stays on same generic Rx from pharmacy.   Sex activity: single partner, contraception - OCP (estrogen/progesterone).  Last Pap: 08/31/21  Results were: no abnormalities /neg HPV DNA Hx of STDs: none  There is no FH of breast cancer. There is no FH of ovarian cancer. The patient does do self-breast exams.  Tobacco use: The patient denies current or previous tobacco use. Alcohol use: rare No drug use.  Exercise: moderately active  She does get adequate calcium and Vitamin D in her diet.  Labs with PCP. Euthyroid 5/22.  Has anxiety/depression, doing pretty well with wellbutrin and buspar combination. Managed by PCP.    Past Medical History:  Diagnosis Date   Anxiety    COVID-19    10/2019 11/2020   Depression    Hypothyroidism    Iron deficiency    Obesity    Thyroid disease     Past Surgical History:  Procedure Laterality Date   CESAREAN SECTION  07/2008   CESAREAN SECTION      Family History  Problem Relation Age of Onset   Hyperlipidemia Mother    Depression Mother    Hypertension Mother    Hypothyroidism Mother    Thyroid disease Mother    Hypertension Father    Diabetes Father    Asthma Father    Heart disease Father    Hyperlipidemia Father    Lupus Sister    Hypothyroidism Sister        hashimotos thyroid removal for precancerous cells   Asthma Sister    Miscarriages / Stillbirths Sister    Depression Son    Heart disease Maternal Grandmother    Dementia Maternal Grandmother    Thyroid disease Maternal Grandmother    Thyroid disease Maternal Grandfather    Colon cancer Maternal  Grandfather 28   Melanoma Maternal Grandfather 2   Cancer Paternal Grandfather        skin     Social History   Socioeconomic History   Marital status: Married    Spouse name: Not on file   Number of children: 1   Years of education: Not on file   Highest education level: Not on file  Occupational History   Not on file  Tobacco Use   Smoking status: Former   Smokeless tobacco: Never   Tobacco comments:    quit smoking 2009   Vaping Use   Vaping Use: Never used  Substance and Sexual Activity   Alcohol use: Yes   Drug use: No   Sexual activity: Yes    Birth control/protection: Pill  Other Topics Concern   Not on file  Social History Narrative   Wears seat belt   Feels safe in relationship    3 kids 2 bio and 1 step son    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      Social Determinants of Health   Financial Resource Strain: Not on file  Food Insecurity: Not on file  Transportation Needs: Not on file  Physical Activity: Insufficiently Active (01/30/2018)   Exercise Vital Sign  Days of Exercise per Week: 3 days    Minutes of Exercise per Session: 30 min  Stress: Stress Concern Present (01/30/2018)   [employer] of Occupational Health - Occupational Stress Questionnaire    Feeling of Stress : To some extent  Social Connections: Moderately Integrated (01/30/2018)   Social Connection and Isolation Panel [NHANES]    Frequency of Communication with Friends and Family: More than three times a week    Frequency of Social Gatherings with Friends and Family: Three times a week    Attends Religious Services: More than 4 times per year    Active Member of Clubs or Organizations: No    Attends Banker Meetings: Never    Marital Status: Married  Catering manager Violence: Not At Risk (01/30/2018)   Humiliation, Afraid, Rape, and Kick questionnaire    Fear of Current or Ex-Partner: No    Emotionally Abused: No    Physically Abused: No    Sexually Abused: No     Outpatient Medications Prior to Visit  Medication Sig Dispense Refill   buPROPion (WELLBUTRIN XL) 300 MG 24 hr tablet TAKE 1 TABLET BY MOUTH EVERY MORNING. 90 tablet 3   busPIRone (BUSPAR) 10 MG tablet TAKE 1 TABLET BY MOUTH 2 TIMES DAILY. 180 tablet 3   cyanocobalamin (VITAMIN B12) 1000 MCG/ML injection Inject 1 mL (1,000 mcg total) into the muscle every 30 (thirty) days. 12 mL 1   drospirenone-ethinyl estradiol (JASMIEL) 3-0.02 MG tablet TAKE 1 TABLET BY MOUTH DAILY. (USE NDC# [phone]) 84 tablet 0   levothyroxine (SYNTHROID) 112 MCG tablet TAKE 1 TABLET DAILY IN MORNING AND 1 & 1/2 TABLETS ON WEDNESDAYS 100 tablet 3   Multiple Vitamins-Minerals (MULTIPLE VITAMINS/WOMENS PO) Take by mouth.     NEEDLE, DISP, 25 G 25G X 1-1/2" MISC 1 Device by Does not apply route every 30 (thirty) days. 12 each 1   phentermine (ADIPEX-P) 37.5 MG tablet Take 1 tablet (37.5 mg total) by mouth daily before breakfast. 1/2 60 tablet 0   phentermine (ADIPEX-P) 37.5 MG tablet Take 1 tablet (37.5 mg total) by mouth daily before breakfast. 2/2 60 tablet 0   rosuvastatin (CRESTOR) 5 MG tablet Take 1 tablet (5 mg total) by mouth daily. 90 tablet 3   tiZANidine (ZANAFLEX) 2 MG tablet Take 1 tablet (2 mg total) by mouth at bedtime as needed for muscle spasms. 30 tablet 2   triamcinolone cream (KENALOG) 0.1 % Apply twice daily to bites as needed. 45 g 3   VITAMIN D PO Take 200 Int'l Units/day by mouth.     No facility-administered medications prior to visit.    ROS:  Review of Systems  Constitutional:  Negative for fatigue, fever and unexpected weight change.  Respiratory:  Negative for cough, shortness of breath and wheezing.   Cardiovascular:  Negative for chest pain, palpitations and leg swelling.  Gastrointestinal:  Negative for blood in stool, constipation, diarrhea, nausea and vomiting.  Endocrine: Negative for cold intolerance, heat intolerance and polyuria.  Genitourinary:  Negative for dyspareunia,  dysuria, flank pain, frequency, genital sores, hematuria, menstrual problem, pelvic pain, urgency, vaginal bleeding, vaginal discharge and vaginal pain.  Musculoskeletal:  Negative for back pain, joint swelling and myalgias.  Skin:  Negative for rash.  Neurological:  Negative for dizziness, syncope, light-headedness, numbness and headaches.  Hematological:  Negative for adenopathy.  Psychiatric/Behavioral:  Positive for agitation. Negative for confusion, dysphoric mood, sleep disturbance and suicidal ideas. The patient is not nervous/anxious.   BREAST: No  symptoms   Objective: There were no vitals taken for this visit.   Physical Exam Constitutional:      Appearance: She is well-developed.  Genitourinary:     Vulva normal.     Right Labia: No rash, tenderness or lesions.    Left Labia: No tenderness, lesions or rash.    No vaginal discharge, erythema or tenderness.      Right Adnexa: not tender and no mass present.    Left Adnexa: not tender and no mass present.    No cervical friability or polyp.     Uterus is not enlarged or tender.  Rectum:     Guaiac result negative.     No anal fissure or external hemorrhoid.  Breasts:    Right: No mass, nipple discharge, skin change or tenderness.     Left: No mass, nipple discharge, skin change or tenderness.  Neck:     Thyroid: No thyromegaly.  Cardiovascular:     Rate and Rhythm: Normal rate and regular rhythm.     Heart sounds: Normal heart sounds. No murmur heard. Pulmonary:     Effort: Pulmonary effort is normal.     Breath sounds: Normal breath sounds.  Abdominal:     Palpations: Abdomen is soft.     Tenderness: There is no abdominal tenderness. There is no guarding or rebound.  Musculoskeletal:        General: Normal range of motion.     Cervical back: Normal range of motion.  Lymphadenopathy:     Cervical: No cervical adenopathy.  Neurological:     General: No focal deficit present.     Mental Status: She is alert and  oriented to person, place, and time.     Cranial Nerves: No cranial nerve deficit.  Skin:    General: Skin is warm and dry.  Psychiatric:        Mood and Affect: Mood normal.        Behavior: Behavior normal.        Thought Content: Thought content normal.        Judgment: Judgment normal.  Vitals reviewed.     Assessment/Plan: Encounter for annual routine gynecological examination  Cervical cancer screening - Plan: Cytology - PAP  Screening for HPV (human papillomavirus) - Plan: Cytology - PAP  Encounter for surveillance of contraceptive pills - Plan: drospirenone-ethinyl estradiol (YAZ) 3-0.02 MG tablet; OCP RF.   Breakthrough bleeding on OCPs--resolved if stays on same generic pills. F/u prn.    No orders of the defined types were placed in this encounter.        GYN counsel adequate intake of calcium and vitamin D, diet and exercise     F/U  No follow-ups on file.  Mayukha Symmonds B. Ramond Darnell, PA-C 10/31/2022 2:44 PM

## 2022-11-01 ENCOUNTER — Other Ambulatory Visit: Payer: Self-pay

## 2022-11-01 ENCOUNTER — Encounter: Payer: Self-pay | Admitting: Obstetrics and Gynecology

## 2022-11-01 ENCOUNTER — Ambulatory Visit (INDEPENDENT_AMBULATORY_CARE_PROVIDER_SITE_OTHER): Payer: No Typology Code available for payment source | Admitting: Obstetrics and Gynecology

## 2022-11-01 VITALS — BP 120/70 | Ht 69.0 in | Wt 213.0 lb

## 2022-11-01 DIAGNOSIS — Z3041 Encounter for surveillance of contraceptive pills: Secondary | ICD-10-CM

## 2022-11-01 DIAGNOSIS — Z01419 Encounter for gynecological examination (general) (routine) without abnormal findings: Secondary | ICD-10-CM

## 2022-11-01 MED ORDER — DROSPIRENONE-ETHINYL ESTRADIOL 3-0.02 MG PO TABS
ORAL_TABLET | ORAL | 3 refills | Status: DC
  Filled 2022-11-01: qty 84, 84d supply, fill #0
  Filled 2023-02-13: qty 84, 84d supply, fill #1
  Filled 2023-05-15: qty 84, 84d supply, fill #2
  Filled 2023-08-07: qty 84, 84d supply, fill #3

## 2022-11-01 NOTE — Patient Instructions (Signed)
I value your feedback and you entrusting us with your care. If you get a [facility] patient survey, I would appreciate you taking the time to let us know about your experience today. Thank you! ? ? ?

## 2022-11-09 ENCOUNTER — Other Ambulatory Visit: Payer: Self-pay

## 2022-11-16 ENCOUNTER — Ambulatory Visit (INDEPENDENT_AMBULATORY_CARE_PROVIDER_SITE_OTHER): Payer: No Typology Code available for payment source | Admitting: Family Medicine

## 2022-11-16 ENCOUNTER — Encounter: Payer: Self-pay | Admitting: Family Medicine

## 2022-11-16 ENCOUNTER — Other Ambulatory Visit: Payer: Self-pay

## 2022-11-16 VITALS — BP 116/74 | HR 71 | Temp 98.2°F | Ht 69.0 in | Wt 210.2 lb

## 2022-11-16 DIAGNOSIS — F39 Unspecified mood [affective] disorder: Secondary | ICD-10-CM | POA: Diagnosis not present

## 2022-11-16 DIAGNOSIS — E669 Obesity, unspecified: Secondary | ICD-10-CM

## 2022-11-16 DIAGNOSIS — E039 Hypothyroidism, unspecified: Secondary | ICD-10-CM

## 2022-11-16 DIAGNOSIS — E063 Autoimmune thyroiditis: Secondary | ICD-10-CM

## 2022-11-16 DIAGNOSIS — R7989 Other specified abnormal findings of blood chemistry: Secondary | ICD-10-CM

## 2022-11-16 MED ORDER — LOMAIRA 8 MG PO TABS
4.0000 mg | ORAL_TABLET | Freq: Every day | ORAL | 0 refills | Status: DC
  Filled 2022-11-16: qty 105, 90d supply, fill #0

## 2022-11-16 MED ORDER — TOPIRAMATE 25 MG PO TABS
25.0000 mg | ORAL_TABLET | Freq: Every day | ORAL | 0 refills | Status: DC
  Filled 2022-11-16: qty 210, 70d supply, fill #0

## 2022-11-16 MED ORDER — VITAMIN D (ERGOCALCIFEROL) 1.25 MG (50000 UNIT) PO CAPS
50000.0000 [IU] | ORAL_CAPSULE | ORAL | 1 refills | Status: DC
  Filled 2022-11-16: qty 12, 84d supply, fill #0
  Filled 2023-02-13: qty 12, 84d supply, fill #1

## 2022-11-16 NOTE — Progress Notes (Signed)
SUBJECTIVE:   Chief Complaint  Patient presents with   Establish Care    Transfer of Care   HPI Patient presents to clinic to transfer care.  No acute concerns today.  Hypothyroid Previously followed by endocrinology, Dr. Lucianne Muss, however given stable TSH has been followed by former PCP.  Currently on levothyroxine 112 mcg Sunday, Monday, Tuesday, Thursday, Friday, Saturday and 168 mcg on Wednesdays.  Asymptomatic.  Requesting TSH/TPO today.  Weight management Patient reports having ongoing issues with fluctuations in weight.  Previously had taken phentermine for short-term with success but unable to maintain weight loss. Requesting to restart Phentermine.  Hyperlipidemia On statin therapy and tolerating well.  Follows with Dr. Rennis Golden, cardiology.  Mood disorder Doing well.  Has been on Wellbutrin 300 mg daily and BuSpar 10 mg twice daily.  Tolerating medication well.  Denies any SI/HI.  Not followed by psychiatry.   PERTINENT PMH / PSH: Hypercholesterolemia Mood disorder Hypothyroidism Obesity class II  OBJECTIVE:  BP 116/74   Pulse 71   Temp 98.2 F (36.8 C)   Ht 5\' 9"  (1.753 m)   Wt 210 lb 3.2 oz (95.3 kg)   LMP 10/28/2022 (Exact Date)   SpO2 98%   BMI 31.04 kg/m    Physical Exam Vitals reviewed.  Constitutional:      General: She is not in acute distress.    Appearance: She is not ill-appearing.  HENT:     Head: Normocephalic.     Nose: Nose normal.  Eyes:     Conjunctiva/sclera: Conjunctivae normal.  Neck:     Thyroid: No thyroid mass, thyromegaly or thyroid tenderness.  Cardiovascular:     Rate and Rhythm: Normal rate and regular rhythm.     Heart sounds: Normal heart sounds.  Pulmonary:     Effort: Pulmonary effort is normal.     Breath sounds: Normal breath sounds.  Abdominal:     General: Abdomen is flat. Bowel sounds are normal.     Palpations: Abdomen is soft.  Musculoskeletal:        General: Normal range of motion.     Cervical back:  Normal range of motion.  Neurological:     Mental Status: She is alert and oriented to person, place, and time. Mental status is at baseline.  Psychiatric:        Mood and Affect: Mood normal.        Behavior: Behavior normal.        Thought Content: Thought content normal.        Judgment: Judgment normal.     ASSESSMENT/PLAN:  Mood disorder (HCC) Assessment & Plan: Chronic.  Asymptomatic.  Doing well on current medications.  Denies any SI/HI. Continue Wellbutrin XL 300 mg daily Continue BuSpar 10 mg twice daily CBT as needed Strict return precautions provided.   Hypothyroidism, unspecified type Assessment & Plan: Chronic.  Asymptomatic.  Tolerating medication well. Thyroid panel today next TPO today If remains stable as has been for years can repeat yearly. Continue Synthroid 112 mcg 6 days a week with 168 mcg on Wednesdays.  Equates to 120 mcg daily.  Orders: -     Thyroid Panel With TSH; Future -     Thyroid Peroxidase Antibodies (TPO) (REFL)  Obesity (BMI 30-39.9) Assessment & Plan: Chronic.  Continues to have concerns with fluctuating weight gain.  Previously on phentermine.  Discussed that phentermine not ideal for long-term weight loss management.  Given history of anxiety would recommend not using higher doses of  phentermine.  Patient has family history of family with precancerous thyroid cells.  Not likely a candidate to start GLP injectable medications.  Discussed using Qsymia, low-dose phentermine combination with topiramate given better long-term results with combination medication.  Lower side effect of anxiety related issues and can be used long-term. Will start combination medication Lomaira and topiramate daily x 2 weeks, titrate up as prescribed.  Will follow-up in 4 to 6 weeks to see if tolerating well and if able to maintain 3% last week.   Low vitamin D level Assessment & Plan: Start vitamin D 1.25 mg weekly x 3 months. Will repeat levels at next  visit  Orders: -     Vitamin D (Ergocalciferol); Take 1 capsule (50,000 Units total) by mouth every 7 (seven) days.  Dispense: 12 capsule; Refill: 1   PDMP reviewed  Return in about 2 months (around 01/17/2023) for PCP, weight.  Dana Allan, MD

## 2022-11-16 NOTE — Patient Instructions (Addendum)
It was a pleasure meeting you today. Thank you for allowing me to take part in your health care.  Our goals for today as we discussed include:  For your weight management Start Topamax 25 mg daily for 2 weeks On week 3 increase to 50 mg daily for 2 weeks On week 5 increase to 75 mg for 8 weeks  Start phentermine 4 mg daily for 2 weeks On week 3 increase to 8 mg daily for 2 weeks On week 5 increase to 12 mg for 8 weeks    For your thyroid Will check TSH levels today  For your low vitamin D level Take vitamin D 1.25 mg weekly for 6 months. Will repeat levels at your next visit.   If you have any questions or concerns, please do not hesitate to call the office at [phone].  I look forward to our next visit and until then take care and stay safe.  Regards,   Dana Allan, MD   [facility]

## 2022-11-17 LAB — NMR, LIPOPROFILE
Cholesterol, Total: 198 mg/dL (ref 100–199)
HDL Particle Number: 55.6 umol/L (ref >=30.5)
HDL-C: 74 mg/dL (ref >39)
LDL Particle Number: 1418 nmol/L — ABNORMAL HIGH (ref <1000)
LDL Size: 21.1 nm (ref >20.5)
LDL-C (NIH Calc): 108 mg/dL — ABNORMAL HIGH (ref 0–99)
LP-IR Score: 37 (ref <=45)
Small LDL Particle Number: 657 nmol/L — ABNORMAL HIGH (ref <=527)
Triglycerides: 90 mg/dL (ref 0–149)

## 2022-11-17 LAB — THYROID PANEL WITH TSH
Free Thyroxine Index: 3.3 (ref 1.4–3.8)
T3 Uptake: 21 % — ABNORMAL LOW (ref 22–35)
T4, Total: 15.8 ug/dL — ABNORMAL HIGH (ref 5.1–11.9)
TSH: 1.21 mIU/L

## 2022-11-17 LAB — THYROID PEROXIDASE ANTIBODIES (TPO) (REFL): Thyroperoxidase Ab SerPl-aCnc: 131 IU/mL — ABNORMAL HIGH (ref <9)

## 2022-11-24 ENCOUNTER — Other Ambulatory Visit: Payer: Self-pay | Admitting: Family Medicine

## 2022-11-24 ENCOUNTER — Encounter: Payer: Self-pay | Admitting: Family Medicine

## 2022-11-24 NOTE — Telephone Encounter (Signed)
removed

## 2022-12-02 ENCOUNTER — Encounter: Payer: Self-pay | Admitting: Family Medicine

## 2022-12-02 DIAGNOSIS — F39 Unspecified mood [affective] disorder: Secondary | ICD-10-CM | POA: Insufficient documentation

## 2022-12-02 DIAGNOSIS — R7989 Other specified abnormal findings of blood chemistry: Secondary | ICD-10-CM | POA: Insufficient documentation

## 2022-12-02 NOTE — Assessment & Plan Note (Signed)
Start vitamin D 1.25 mg weekly x 3 months. Will repeat levels at next visit

## 2022-12-02 NOTE — Assessment & Plan Note (Signed)
Chronic.  Asymptomatic.  Doing well on current medications.  Denies any SI/HI. Continue Wellbutrin XL 300 mg daily Continue BuSpar 10 mg twice daily CBT as needed Strict return precautions provided.

## 2022-12-02 NOTE — Assessment & Plan Note (Signed)
Chronic.  Continues to have concerns with fluctuating weight gain.  Previously on phentermine.  Discussed that phentermine not ideal for long-term weight loss management.  Given history of anxiety would recommend not using higher doses of phentermine.  Patient has family history of family with precancerous thyroid cells.  Not likely a candidate to start GLP injectable medications.  Discussed using Qsymia, low-dose phentermine combination with topiramate given better long-term results with combination medication.  Lower side effect of anxiety related issues and can be used long-term. Will start combination medication Lomaira and topiramate daily x 2 weeks, titrate up as prescribed.  Will follow-up in 4 to 6 weeks to see if tolerating well and if able to maintain 3% last week.

## 2022-12-02 NOTE — Assessment & Plan Note (Signed)
Chronic.  Asymptomatic.  Tolerating medication well. Thyroid panel today next TPO today If remains stable as has been for years can repeat yearly. Continue Synthroid 112 mcg 6 days a week with 168 mcg on Wednesdays.  Equates to 120 mcg daily.

## 2022-12-30 ENCOUNTER — Encounter: Payer: Self-pay | Admitting: Internal Medicine

## 2023-01-02 ENCOUNTER — Encounter: Payer: Self-pay | Admitting: Family

## 2023-01-02 ENCOUNTER — Ambulatory Visit (INDEPENDENT_AMBULATORY_CARE_PROVIDER_SITE_OTHER): Payer: 59 | Admitting: Family

## 2023-01-02 VITALS — BP 128/78 | HR 89 | Temp 97.9°F | Ht 69.0 in | Wt 212.8 lb

## 2023-01-02 DIAGNOSIS — J029 Acute pharyngitis, unspecified: Secondary | ICD-10-CM

## 2023-01-02 LAB — POCT INFLUENZA A/B
Influenza A, POC: NEGATIVE
Influenza B, POC: NEGATIVE

## 2023-01-02 LAB — POCT RAPID STREP A (OFFICE): Rapid Strep A Screen: NEGATIVE

## 2023-01-02 LAB — POC COVID19 BINAXNOW: SARS Coronavirus 2 Ag: NEGATIVE

## 2023-01-02 NOTE — Patient Instructions (Signed)
Sore Throat A sore throat is pain, burning, irritation, or scratchiness in the throat. When you have a sore throat, you may feel pain or tenderness in your throat when you swallow or talk. Many things can cause a sore throat, including: An infection. Seasonal allergies. Dryness in the air. Irritants, such as smoke or pollution. Radiation treatment for cancer. Gastroesophageal reflux disease (GERD). A tumor. A sore throat is often the first sign of another sickness. It may happen with other symptoms, such as coughing, sneezing, fever, and swollen neck glands. Most sore throats go away without medical treatment. Follow these instructions at home:     Medicines Take over-the-counter and prescription medicines only as told by your health care provider. Children often get sore throats. Do not give your child aspirin because of the association with Reye's syndrome. Use throat sprays to soothe your throat as told by your health care provider. Managing pain To help with pain, try: Sipping warm liquids, such as broth, herbal tea, or warm water. Eating or drinking cold or frozen liquids, such as frozen ice pops. Gargling with a mixture of salt and water 3-4 times a day or as needed. To make salt water, completely dissolve -1 tsp (3-6 g) of salt in 1 cup (237 mL) of warm water. Sucking on hard candy or throat lozenges. Putting a cool-mist humidifier in your bedroom at night to moisten the air. Sitting in the bathroom with the door closed for 5-10 minutes while you run hot water in the shower. General instructions Do not use any products that contain nicotine or tobacco. These products include cigarettes, chewing tobacco, and vaping devices, such as e-cigarettes. If you need help quitting, ask your health care provider. Rest as needed. Drink enough fluid to keep your urine pale yellow. Wash your hands often with soap and water for at least 20 seconds. If soap and water are not available, use hand  sanitizer. Contact a health care provider if: You have a fever for more than 2-3 days. You have symptoms that last for more than 2-3 days. Your throat does not get better within 7 days. You have a fever and your symptoms suddenly get worse. Get help right away if: You have difficulty breathing. You cannot swallow fluids, soft foods, or your saliva. You have increased swelling in your throat or neck. You have persistent nausea and vomiting. These symptoms may represent a serious problem that is an emergency. Do not wait to see if the symptoms will go away. Get medical help right away. Call your local emergency services (911 in the [place] Do not drive yourself to the hospital. Summary A sore throat is pain, burning, irritation, or scratchiness in the throat. Many things can cause a sore throat. Take over-the-counter medicines only as told by your health care provider. Rest as needed. Drink enough fluid to keep your urine pale yellow. Contact a health care provider if your throat does not get better within 7 days. This information is not intended to replace advice given to you by your health care provider. Make sure you discuss any questions you have with your health care provider. Document Revised: 02/10/2021 Document Reviewed: 02/10/2021 Elsevier Patient Education  [employer].

## 2023-01-02 NOTE — Progress Notes (Signed)
Assessment & Plan:  Sore throat -     Culture, Group A Strep -     POC COVID-19 BinaxNow -     POCT Influenza A/B -     POCT rapid strep A  Pharyngitis, unspecified etiology Assessment & Plan: Well appearing, nontoxic. POC strep negative. Suspect viral. Pending culture. Patient was comfortable awaiting culture to monitor versus empiric antibiotics if POC strep negative.   She will let me know how she is doing      Return precautions given.   Risks, benefits, and alternatives of the medications and treatment plan prescribed today were discussed, and patient expressed understanding.   Education regarding symptom management and diagnosis given to patient on AVS either electronically or printed.  No follow-ups on file.  Mable Paris, FNP  Subjective:    Patient ID: Kaitlyn Dalton, female    DOB: 11-Sep-1986, 37 y.o.   MRN: [id number]  CC: Kaitlyn Dalton is a 37 y.o. female who presents today for an acute visit.    HPI: Complains of sore throat x 1 day Endorses bilateral sore throat pain.   Otherwise she feels well.   No pnd, ear pain, sinus congestion, fever.   57 year old child has strep throat whom she has had close contact.      Former smoker Allergies: Patient has no known allergies. Current Outpatient Medications on File Prior to Visit  Medication Sig Dispense Refill   buPROPion (WELLBUTRIN XL) 300 MG 24 hr tablet TAKE 1 TABLET BY MOUTH EVERY MORNING. 90 tablet 3   busPIRone (BUSPAR) 10 MG tablet TAKE 1 TABLET BY MOUTH 2 TIMES DAILY. 180 tablet 3   cyanocobalamin (VITAMIN B12) 1000 MCG/ML injection Inject 1 mL (1,000 mcg total) into the muscle every 30 (thirty) days. 12 mL 1   drospirenone-ethinyl estradiol (JASMIEL) 3-0.02 MG tablet TAKE 1 TABLET BY MOUTH DAILY. (USE NDC# [phone]) 84 tablet 3   FIBER ADULT GUMMIES PO Take by mouth.     levothyroxine (SYNTHROID) 112 MCG tablet TAKE 1 TABLET DAILY IN MORNING AND 1 & 1/2 TABLETS ON WEDNESDAYS 100  tablet 3   Multiple Vitamins-Minerals (MULTIPLE VITAMINS/WOMENS PO) Take by mouth.     NEEDLE, DISP, 25 G 25G X 1-1/2" MISC 1 Device by Does not apply route every 30 (thirty) days. 12 each 1   rosuvastatin (CRESTOR) 5 MG tablet Take 1 tablet (5 mg total) by mouth daily. 90 tablet 3   tiZANidine (ZANAFLEX) 2 MG tablet Take 1 tablet (2 mg total) by mouth at bedtime as needed for muscle spasms. 30 tablet 2   triamcinolone cream (KENALOG) 0.1 % Apply twice daily to bites as needed. 45 g 3   VITAMIN D PO Take 200 Int'l Units/day by mouth.     Vitamin D, Ergocalciferol, (DRISDOL) 1.25 MG (50000 UNIT) CAPS capsule Take 1 capsule (50,000 Units total) by mouth every 7 (seven) days. 12 capsule 1   No current facility-administered medications on file prior to visit.    Review of Systems  Constitutional:  Negative for chills and fever.  HENT:  Positive for sore throat and voice change. Negative for congestion and trouble swallowing.   Respiratory:  Negative for cough.   Cardiovascular:  Negative for chest pain and palpitations.  Gastrointestinal:  Negative for nausea and vomiting.      Objective:    BP 128/78   Pulse 89   Temp 97.9 F (36.6 C) (Oral)   Ht 5\' 9"  (1.753 m)  Wt 212 lb 12.8 oz (96.5 kg)   LMP  (LMP Unknown)   SpO2 99%   BMI 31.43 kg/m   BP Readings from Last 3 Encounters:  01/02/23 128/78  11/16/22 116/74  11/01/22 120/70   Wt Readings from Last 3 Encounters:  01/02/23 212 lb 12.8 oz (96.5 kg)  11/16/22 210 lb 3.2 oz (95.3 kg)  11/01/22 213 lb (96.6 kg)    Physical Exam Vitals reviewed.  Constitutional:      Appearance: She is well-developed.  HENT:     Head: Normocephalic and atraumatic.     Right Ear: Hearing, tympanic membrane, ear canal and external ear normal. No decreased hearing noted. No drainage, swelling or tenderness. No middle ear effusion. No foreign body. Tympanic membrane is not erythematous or bulging.     Left Ear: Hearing, tympanic membrane, ear  canal and external ear normal. No decreased hearing noted. No drainage, swelling or tenderness.  No middle ear effusion. No foreign body. Tympanic membrane is not erythematous or bulging.     Nose: Nose normal. No rhinorrhea.     Right Sinus: No maxillary sinus tenderness or frontal sinus tenderness.     Left Sinus: No maxillary sinus tenderness or frontal sinus tenderness.     Mouth/Throat:     Pharynx: Uvula midline. Posterior oropharyngeal erythema present. No oropharyngeal exudate.     Tonsils: No tonsillar abscesses.     Comments: Tonsil size 2/4.  Eyes:     Conjunctiva/sclera: Conjunctivae normal.  Cardiovascular:     Rate and Rhythm: Regular rhythm.     Pulses: Normal pulses.     Heart sounds: Normal heart sounds.  Pulmonary:     Effort: Pulmonary effort is normal.     Breath sounds: Normal breath sounds. No wheezing, rhonchi or rales.  Lymphadenopathy:     Head:     Right side of head: No submental, submandibular, tonsillar, preauricular, posterior auricular or occipital adenopathy.     Left side of head: No submental, submandibular, tonsillar, preauricular, posterior auricular or occipital adenopathy.     Cervical: No cervical adenopathy.  Skin:    General: Skin is warm and dry.  Neurological:     Mental Status: She is alert.  Psychiatric:        Speech: Speech normal.        Behavior: Behavior normal.        Thought Content: Thought content normal.

## 2023-01-02 NOTE — Assessment & Plan Note (Addendum)
Well appearing, nontoxic. POC strep negative. Suspect viral. Pending culture. Patient was comfortable awaiting culture to monitor versus empiric antibiotics if POC strep negative.   She will let me know how she is doing

## 2023-01-04 ENCOUNTER — Other Ambulatory Visit: Payer: Self-pay

## 2023-01-04 ENCOUNTER — Other Ambulatory Visit: Payer: Self-pay | Admitting: Family

## 2023-01-04 DIAGNOSIS — J020 Streptococcal pharyngitis: Secondary | ICD-10-CM

## 2023-01-04 LAB — CULTURE, GROUP A STREP
MICRO NUMBER:: 14519114
SPECIMEN QUALITY:: ADEQUATE

## 2023-01-04 MED ORDER — PENICILLIN V POTASSIUM 500 MG PO TABS
500.0000 mg | ORAL_TABLET | Freq: Two times a day (BID) | ORAL | 0 refills | Status: DC
  Filled 2023-01-04: qty 20, 10d supply, fill #0

## 2023-01-09 ENCOUNTER — Encounter: Payer: Self-pay | Admitting: Family

## 2023-01-09 NOTE — Telephone Encounter (Signed)
Pt call back on an update from previous message.

## 2023-01-09 NOTE — Telephone Encounter (Signed)
LVM to see if pt would like an in person appt

## 2023-01-09 NOTE — Telephone Encounter (Signed)
Pt called back I read the message to her and she stated she does not think she needs an in person appointment, It was just and allergic reaction to medication. If she need an appointment she will make one

## 2023-01-09 NOTE — Telephone Encounter (Signed)
NOTED

## 2023-01-10 ENCOUNTER — Encounter: Payer: Self-pay | Admitting: Family

## 2023-01-10 ENCOUNTER — Other Ambulatory Visit: Payer: Self-pay | Admitting: Family

## 2023-01-10 ENCOUNTER — Other Ambulatory Visit: Payer: Self-pay

## 2023-01-10 DIAGNOSIS — J029 Acute pharyngitis, unspecified: Secondary | ICD-10-CM

## 2023-01-10 MED ORDER — AZITHROMYCIN 250 MG PO TABS
ORAL_TABLET | ORAL | 0 refills | Status: AC
  Filled 2023-01-10: qty 6, 5d supply, fill #0

## 2023-01-23 ENCOUNTER — Other Ambulatory Visit: Payer: Self-pay

## 2023-02-03 ENCOUNTER — Ambulatory Visit (HOSPITAL_BASED_OUTPATIENT_CLINIC_OR_DEPARTMENT_OTHER): Payer: 59 | Admitting: Internal Medicine

## 2023-02-03 ENCOUNTER — Encounter (HOSPITAL_BASED_OUTPATIENT_CLINIC_OR_DEPARTMENT_OTHER): Payer: Self-pay | Admitting: Internal Medicine

## 2023-02-03 VITALS — BP 114/76 | HR 88 | Ht 69.0 in | Wt 220.0 lb

## 2023-02-03 DIAGNOSIS — Z8249 Family history of ischemic heart disease and other diseases of the circulatory system: Secondary | ICD-10-CM | POA: Diagnosis not present

## 2023-02-03 DIAGNOSIS — E785 Hyperlipidemia, unspecified: Secondary | ICD-10-CM

## 2023-02-03 DIAGNOSIS — E7841 Elevated Lipoprotein(a): Secondary | ICD-10-CM

## 2023-02-03 NOTE — Progress Notes (Signed)
LIPID CLINIC CONSULT NOTE  Chief Complaint:  Manage dyslipidemia  Primary Care Physician: Carollee Leitz, MD  Primary Cardiologist:  None  HPI:  Kaitlyn Dalton is a 37 y.o. female who is being seen today for the evaluation of dyslipidemia at the request of Carollee Leitz, MD. This is a pleasant 37 year old female who works for [employer] and is a Marine scientist by training.  She has a history of Hashimoto's thyroiditis which has been well managed on levothyroxine.  Otherwise she takes a few additional medications and has few traditional risk factors other than a family history of early onset heart disease.  In fact both of her parents had early heart disease with her father having had coronary stents in his 56s and is in her mother with a stent in her 75s.  Her maternal grandfather also had congestive heart failure symptoms.  She reports some longstanding history of dyslipidemia with moderately elevated cholesterol.  Total of 248 recently, triglycerides 119, HDL 68 and LDL 156.  This very somewhat with diet and activity levels but in general has not ever been much lower than 200 for total cholesterol.  She has not been on any therapy for this.  She was referred today for recommendations regarding management.  She reports a varied diet but is fairly healthy with low in saturated fat.  She says she exercises fairly regularly but does have 3 children at home.  She is asymptomatic denying chest pain or worsening shortness of breath.  02/03/2023  Kaitlyn Dalton today for follow-up.  She reports she has been taking her statin on a daily basis and seems to be doing well with it.  She has no side effects.  Initially it was an issue trying to remember it regularly but she has been taking it regularly and it has led to improvement in her lipids.  Her LDL particle numbers come down to 1418 from 1804.  LDL-C is now 108 down from 138.  This is a reasonably good improvement.  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  We discussed the fact that diet, exercise and other lifestyle modifications also will not impact the LP(a).  At this point, however I do not think she will qualify for PCSK9 inhibitor.  PMHx:  Past Medical History:  Diagnosis Date   Annual physical exam 04/09/2020   Anxiety    COVID-19    10/2019 11/2020   Depression    External hemorrhoid 06/11/2019   Hashimoto's thyroiditis 04/29/2022   Hyperhidrosis 01/05/2018   Hypothyroidism    Iron deficiency    Iron deficiency 08/30/2018   Obesity    Thyroid disease     Past Surgical History:  Procedure Laterality Date   CESAREAN SECTION  07/2008   CESAREAN SECTION      FAMHx:  Family History  Problem Relation Age of Onset   Hyperlipidemia Mother    Depression Mother    Hypertension Mother    Hypothyroidism Mother    Thyroid disease Mother    Hypertension Father    Diabetes Father    Asthma Father    Heart disease Father    Hyperlipidemia Father    Lupus Sister    Hypothyroidism Sister        hashimotos thyroid removal for precancerous cells   Asthma Sister    Miscarriages / Stillbirths Sister    Depression Son    Heart disease Maternal Grandmother  Dementia Maternal Grandmother    Thyroid disease Maternal Grandmother    Thyroid disease Maternal Grandfather    Colon cancer Maternal Grandfather 1   Melanoma Maternal Grandfather 32   Cancer Paternal Grandfather        skin     SOCHx:   reports that she has quit smoking. She has never used smokeless tobacco. She reports current alcohol use. She reports that she does not use drugs.  ALLERGIES:  Allergies  Allergen Reactions   Penicillins Hives    Hives during treatment of strep pharyngitis    ROS: Pertinent items noted in HPI and remainder of comprehensive ROS otherwise negative.  HOME MEDS: Current Outpatient Medications on File Prior to Visit  Medication Sig Dispense Refill    buPROPion (WELLBUTRIN XL) 300 MG 24 hr tablet TAKE 1 TABLET BY MOUTH EVERY MORNING. 90 tablet 3   busPIRone (BUSPAR) 10 MG tablet TAKE 1 TABLET BY MOUTH 2 TIMES DAILY. 180 tablet 3   cyanocobalamin (VITAMIN B12) 1000 MCG/ML injection Inject 1 mL (1,000 mcg total) into the muscle every 30 (thirty) days. 12 mL 1   drospirenone-ethinyl estradiol (JASMIEL) 3-0.02 MG tablet TAKE 1 TABLET BY MOUTH DAILY. (USE NDC# [phone]) 84 tablet 3   FIBER ADULT GUMMIES PO Take by mouth.     levothyroxine (SYNTHROID) 112 MCG tablet TAKE 1 TABLET DAILY IN MORNING AND 1 & 1/2 TABLETS ON WEDNESDAYS 100 tablet 3   Multiple Vitamins-Minerals (MULTIPLE VITAMINS/WOMENS PO) Take by mouth.     NEEDLE, DISP, 25 G 25G X 1-1/2" MISC 1 Device by Does not apply route every 30 (thirty) days. 12 each 1   rosuvastatin (CRESTOR) 5 MG tablet Take 1 tablet (5 mg total) by mouth daily. 90 tablet 3   tiZANidine (ZANAFLEX) 2 MG tablet Take 1 tablet (2 mg total) by mouth at bedtime as needed for muscle spasms. 30 tablet 2   triamcinolone cream (KENALOG) 0.1 % Apply twice daily to bites as needed. 45 g 3   VITAMIN D PO Take 200 Int'l Units/day by mouth.     Vitamin D, Ergocalciferol, (DRISDOL) 1.25 MG (50000 UNIT) CAPS capsule Take 1 capsule (50,000 Units total) by mouth every 7 (seven) days. 12 capsule 1   No current facility-administered medications on file prior to visit.    LABS/IMAGING: No results found for this or any previous visit (from the past 48 hour(s)). No results found.  LIPID PANEL:    Component Value Date/Time   CHOL 248 (H) 10/29/2021 0905   CHOL 218 (H) 03/25/2019 0827   TRIG 119.0 10/29/2021 0905   HDL 68.20 10/29/2021 0905   HDL 57 03/25/2019 0827   CHOLHDL 4 10/29/2021 0905   VLDL 23.8 10/29/2021 0905   LDLCALC 156 (H) 10/29/2021 0905   LDLCALC 143 (H) 03/25/2019 0827    WEIGHTS: Wt Readings from Last 3 Encounters:  02/03/23 220 lb (99.8 kg)  01/02/23 212 lb 12.8 oz (96.5 kg)  11/16/22 210 lb 3.2  oz (95.3 kg)    VITALS: BP 114/76   Pulse 88   Ht '5\' 9"'$  (1.753 m)   Wt 220 lb (99.8 kg)   BMI 32.49 kg/m   EXAM: Deferred  EKG: Deferred  ASSESSMENT: Mixed dyslipidemia, 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   Kaitlyn Dalton has had a reasonably good reduction in her LDL cholesterol on low-dose rosuvastatin.  She is tolerating this well.  LDL now down to 108.  I think with further lifestyle modifications it may get a little lower although LP(a) is a factor.  She would not qualify for specific therapy for this, of which only PCSK9 inhibitors can affect this.  Perhaps in the future she might be a candidate.  For now I would continue her current therapy.  I am happy to see her back on an as-needed basis.  Pixie Casino, MD, [facility], [place] Director of the [facility] Lipid Disorders &  Cardiovascular Risk Reduction Clinic Diplomate of the American Board of Clinical Lipidology Attending Cardiologist  Direct Dial: [phone]  Fax: [phone]  Website:  [email or web address] 02/03/2023, 11:38 AM

## 2023-02-03 NOTE — Patient Instructions (Signed)
Medication Instructions:  Continue current medication  *If you need a refill on your cardiac medications before your next appointment, please call your pharmacy*   Lab Work: None Ordered  If you have labs (blood work) drawn today and your tests are completely normal, you will receive your results only by: [place] (if you have MyChart) OR A paper copy in the mail If you have any lab test that is abnormal or we need to change your treatment, we will call you to review the results.   Testing/Procedures: None Ordered   Follow-Up: At [facility], you and your health needs are our priority.  As part of our continuing mission to provide you with exceptional heart care, we have created designated Provider Care Teams.  These Care Teams include your primary Cardiologist (physician) and Advanced Practice Providers (APPs -  Physician Assistants and Nurse Practitioners) who all work together to provide you with the care you need, when you need it.  We recommend signing up for the patient portal called "MyChart".  Sign up information is provided on this After Visit Summary.  MyChart is used to connect with patients for Virtual Visits (Telemedicine).  Patients are able to view lab/test results, encounter notes, upcoming appointments, etc.  Non-urgent messages can be sent to your provider as well.   To learn more about what you can do with MyChart, go to [email or web address].    Your next appointment:   As Needed

## 2023-02-13 ENCOUNTER — Other Ambulatory Visit: Payer: Self-pay

## 2023-02-16 ENCOUNTER — Ambulatory Visit: Payer: 59 | Admitting: Family Medicine

## 2023-03-23 ENCOUNTER — Ambulatory Visit (INDEPENDENT_AMBULATORY_CARE_PROVIDER_SITE_OTHER): Payer: 59 | Admitting: Family

## 2023-03-23 ENCOUNTER — Encounter: Payer: Self-pay | Admitting: Family

## 2023-03-23 ENCOUNTER — Other Ambulatory Visit: Payer: Self-pay

## 2023-03-23 VITALS — BP 128/76 | HR 67 | Temp 98.3°F | Ht 69.0 in | Wt 216.4 lb

## 2023-03-23 DIAGNOSIS — J0100 Acute maxillary sinusitis, unspecified: Secondary | ICD-10-CM

## 2023-03-23 DIAGNOSIS — J329 Chronic sinusitis, unspecified: Secondary | ICD-10-CM | POA: Insufficient documentation

## 2023-03-23 MED ORDER — DOXYCYCLINE HYCLATE 100 MG PO TABS
100.0000 mg | ORAL_TABLET | Freq: Two times a day (BID) | ORAL | 0 refills | Status: DC
  Filled 2023-03-23: qty 10, 5d supply, fill #0

## 2023-03-23 NOTE — Progress Notes (Signed)
Assessment & Plan:  Acute non-recurrent maxillary sinusitis Assessment & Plan: Duration 6 days.  Discussed with patient more likely viral etiology based on duration.  Patient however has failed conservative therapy at home including Mucinex and Sudafed.  I provided her with doxycycline to take if symptoms do not improve in the next 1 to 2 days.  Encouraged probiotics.  Advised sun protection while on doxycycline.  She will let me know how she is doing  Orders: -     Doxycycline Hyclate; Take 1 tablet (100 mg total) by mouth 2 (two) times daily.  Dispense: 10 tablet; Refill: 0     Return precautions given.   Risks, benefits, and alternatives of the medications and treatment plan prescribed today were discussed, and patient expressed understanding.   Education regarding symptom management and diagnosis given to patient on AVS either electronically or printed.  No follow-ups on file.  Rennie Plowman, FNP  Subjective:    Patient ID: Kaitlyn Dalton, female    DOB: Apr 30, 1986, 37 y.o.   MRN: [id number]  CC: Kaitlyn Dalton is a 37 y.o. female who presents today for an acute visit.    HPI: Complains bilateral sinus pressure worse on left side,  and ear pain x 6 days, unchanged.    Endorses productive cough, ears poppping   She was been taking mucinex, sudafed without relief.    Sore throat resolved from prior.     Allergies: Penicillins Current Outpatient Medications on File Prior to Visit  Medication Sig Dispense Refill   buPROPion (WELLBUTRIN XL) 300 MG 24 hr tablet TAKE 1 TABLET BY MOUTH EVERY MORNING. 90 tablet 3   busPIRone (BUSPAR) 10 MG tablet TAKE 1 TABLET BY MOUTH 2 TIMES DAILY. 180 tablet 3   cyanocobalamin (VITAMIN B12) 1000 MCG/ML injection Inject 1 mL (1,000 mcg total) into the muscle every 30 (thirty) days. 12 mL 1   drospirenone-ethinyl estradiol (JASMIEL) 3-0.02 MG tablet TAKE 1 TABLET BY MOUTH DAILY. (USE NDC# [phone]) 84 tablet 3   FIBER  ADULT GUMMIES PO Take by mouth.     levothyroxine (SYNTHROID) 112 MCG tablet TAKE 1 TABLET DAILY IN MORNING AND 1 & 1/2 TABLETS ON WEDNESDAYS 100 tablet 3   Multiple Vitamins-Minerals (MULTIPLE VITAMINS/WOMENS PO) Take by mouth.     NEEDLE, DISP, 25 G 25G X 1-1/2" MISC 1 Device by Does not apply route every 30 (thirty) days. 12 each 1   rosuvastatin (CRESTOR) 5 MG tablet Take 1 tablet (5 mg total) by mouth daily. 90 tablet 3   tiZANidine (ZANAFLEX) 2 MG tablet Take 1 tablet (2 mg total) by mouth at bedtime as needed for muscle spasms. 30 tablet 2   triamcinolone cream (KENALOG) 0.1 % Apply twice daily to bites as needed. 45 g 3   VITAMIN D PO Take 200 Int'l Units/day by mouth.     Vitamin D, Ergocalciferol, (DRISDOL) 1.25 MG (50000 UNIT) CAPS capsule Take 1 capsule (50,000 Units total) by mouth every 7 (seven) days. 12 capsule 1   No current facility-administered medications on file prior to visit.    Review of Systems  Constitutional:  Negative for chills and fever.  HENT:  Positive for congestion and ear pain. Negative for ear discharge and sore throat.   Respiratory:  Positive for cough.   Cardiovascular:  Negative for chest pain and palpitations.  Gastrointestinal:  Negative for nausea and vomiting.      Objective:    BP 128/76   Pulse 67  Temp 98.3 F (36.8 C) (Oral)   Ht 5\' 9"  (1.753 m)   Wt 216 lb 6.4 oz (98.2 kg)   SpO2 97%   BMI 31.96 kg/m   BP Readings from Last 3 Encounters:  03/23/23 128/76  02/03/23 114/76  01/02/23 128/78   Wt Readings from Last 3 Encounters:  03/23/23 216 lb 6.4 oz (98.2 kg)  02/03/23 220 lb (99.8 kg)  01/02/23 212 lb 12.8 oz (96.5 kg)    Physical Exam Vitals reviewed.  Constitutional:      Appearance: She is well-developed.  HENT:     Head: Normocephalic and atraumatic.     Right Ear: Hearing, tympanic membrane, ear canal and external ear normal. No decreased hearing noted. No drainage, swelling or tenderness. No middle ear effusion.  No foreign body. Tympanic membrane is not erythematous or bulging.     Left Ear: Hearing, tympanic membrane, ear canal and external ear normal. No decreased hearing noted. No drainage, swelling or tenderness.  No middle ear effusion. No foreign body. Tympanic membrane is not erythematous or bulging.     Nose: Nose normal. No rhinorrhea.     Right Sinus: No maxillary sinus tenderness or frontal sinus tenderness.     Left Sinus: No maxillary sinus tenderness or frontal sinus tenderness.     Mouth/Throat:     Pharynx: Uvula midline. No oropharyngeal exudate or posterior oropharyngeal erythema.     Tonsils: No tonsillar abscesses.  Eyes:     Conjunctiva/sclera: Conjunctivae normal.  Cardiovascular:     Rate and Rhythm: Regular rhythm.     Pulses: Normal pulses.     Heart sounds: Normal heart sounds.  Pulmonary:     Effort: Pulmonary effort is normal.     Breath sounds: Normal breath sounds. No wheezing, rhonchi or rales.  Lymphadenopathy:     Head:     Right side of head: No submental, submandibular, tonsillar, preauricular, posterior auricular or occipital adenopathy.     Left side of head: No submental, submandibular, tonsillar, preauricular, posterior auricular or occipital adenopathy.     Cervical: No cervical adenopathy.  Skin:    General: Skin is warm and dry.  Neurological:     Mental Status: She is alert.  Psychiatric:        Speech: Speech normal.        Behavior: Behavior normal.        Thought Content: Thought content normal.

## 2023-03-25 NOTE — Assessment & Plan Note (Signed)
Duration 6 days.  Discussed with patient more likely viral etiology based on duration.  Patient however has failed conservative therapy at home including Mucinex and Sudafed.  I provided her with doxycycline to take if symptoms do not improve in the next 1 to 2 days.  Encouraged probiotics.  Advised sun protection while on doxycycline.  She will let me know how she is doing

## 2023-03-25 NOTE — Patient Instructions (Signed)
If symptoms not improved on continued Mucinex, plenty of water, I think it is reasonable that point to start antibiotic, doxycycline.  Doxycycline can make it easier to develop a sunburn so please use sun protection  Ensure to take probiotics while on antibiotics and also for 2 weeks after completion. This can either be by eating yogurt daily or taking a probiotic supplement over the counter such as Culturelle.It is important to re-colonize the gut with good bacteria and also to prevent any diarrheal infections associated with antibiotic use.    Nice to see you!

## 2023-04-06 ENCOUNTER — Other Ambulatory Visit: Payer: Self-pay

## 2023-05-18 ENCOUNTER — Ambulatory Visit (INDEPENDENT_AMBULATORY_CARE_PROVIDER_SITE_OTHER): Payer: 59 | Admitting: Family Medicine

## 2023-05-18 ENCOUNTER — Other Ambulatory Visit: Payer: Self-pay

## 2023-05-18 ENCOUNTER — Encounter: Payer: Self-pay | Admitting: Family Medicine

## 2023-05-18 VITALS — BP 112/78 | HR 71 | Temp 98.2°F | Ht 69.0 in | Wt 219.0 lb

## 2023-05-18 DIAGNOSIS — E039 Hypothyroidism, unspecified: Secondary | ICD-10-CM

## 2023-05-18 DIAGNOSIS — E782 Mixed hyperlipidemia: Secondary | ICD-10-CM | POA: Diagnosis not present

## 2023-05-18 DIAGNOSIS — F3289 Other specified depressive episodes: Secondary | ICD-10-CM

## 2023-05-18 DIAGNOSIS — E538 Deficiency of other specified B group vitamins: Secondary | ICD-10-CM | POA: Diagnosis not present

## 2023-05-18 DIAGNOSIS — Z6832 Body mass index (BMI) 32.0-32.9, adult: Secondary | ICD-10-CM | POA: Diagnosis not present

## 2023-05-18 DIAGNOSIS — E669 Obesity, unspecified: Secondary | ICD-10-CM | POA: Diagnosis not present

## 2023-05-18 DIAGNOSIS — F1590 Other stimulant use, unspecified, uncomplicated: Secondary | ICD-10-CM

## 2023-05-18 DIAGNOSIS — F39 Unspecified mood [affective] disorder: Secondary | ICD-10-CM | POA: Diagnosis not present

## 2023-05-18 DIAGNOSIS — E785 Hyperlipidemia, unspecified: Secondary | ICD-10-CM | POA: Diagnosis not present

## 2023-05-18 DIAGNOSIS — E559 Vitamin D deficiency, unspecified: Secondary | ICD-10-CM | POA: Diagnosis not present

## 2023-05-18 LAB — LIPID PANEL
Cholesterol: 190 mg/dL (ref 0–200)
HDL: 67.2 mg/dL (ref >39.00)
LDL Cholesterol: 109 mg/dL — ABNORMAL HIGH (ref 0–99)
NonHDL: 123.09
Total CHOL/HDL Ratio: 3
Triglycerides: 68 mg/dL (ref 0.0–149.0)
VLDL: 13.6 mg/dL (ref 0.0–40.0)

## 2023-05-18 LAB — COMPREHENSIVE METABOLIC PANEL
ALT: 26 U/L (ref 0–35)
AST: 21 U/L (ref 0–37)
Albumin: 4.3 g/dL (ref 3.5–5.2)
Alkaline Phosphatase: 76 U/L (ref 39–117)
BUN: 9 mg/dL (ref 6–23)
CO2: 30 mEq/L (ref 19–32)
Calcium: 9.5 mg/dL (ref 8.4–10.5)
Chloride: 103 mEq/L (ref 96–112)
Creatinine, Ser: 0.8 mg/dL (ref 0.40–1.20)
GFR: 94.91 mL/min (ref >60.00)
Glucose, Bld: 86 mg/dL (ref 70–99)
Potassium: 4.4 mEq/L (ref 3.5–5.1)
Sodium: 140 mEq/L (ref 135–145)
Total Bilirubin: 0.4 mg/dL (ref 0.2–1.2)
Total Protein: 7.3 g/dL (ref 6.0–8.3)

## 2023-05-18 LAB — VITAMIN B12: Vitamin B-12: 539 pg/mL (ref 211–911)

## 2023-05-18 LAB — T4, FREE: Free T4: 1.13 ng/dL (ref 0.60–1.60)

## 2023-05-18 LAB — TSH: TSH: 0.49 u[IU]/mL (ref 0.35–5.50)

## 2023-05-18 LAB — VITAMIN D 25 HYDROXY (VIT D DEFICIENCY, FRACTURES): VITD: 61.87 ng/mL (ref 30.00–100.00)

## 2023-05-18 MED ORDER — ROSUVASTATIN CALCIUM 5 MG PO TABS
5.0000 mg | ORAL_TABLET | Freq: Every day | ORAL | 3 refills | Status: DC
Start: 2023-05-18 — End: 2024-08-09
  Filled 2023-05-18 – 2023-07-10 (×2): qty 90, 90d supply, fill #0
  Filled 2023-10-16: qty 90, 90d supply, fill #1
  Filled 2024-01-18: qty 90, 90d supply, fill #2
  Filled 2024-04-19: qty 90, 90d supply, fill #3

## 2023-05-18 MED ORDER — LEVOTHYROXINE SODIUM 112 MCG PO TABS
112.0000 ug | ORAL_TABLET | Freq: Every morning | ORAL | 3 refills | Status: DC
Start: 2023-05-18 — End: 2023-11-20
  Filled 2023-05-18: qty 100, 90d supply, fill #0
  Filled 2023-08-18: qty 100, 90d supply, fill #1

## 2023-05-18 MED ORDER — PHENTERMINE HCL 37.5 MG PO TABS
37.5000 mg | ORAL_TABLET | Freq: Every day | ORAL | 1 refills | Status: DC
Start: 2023-05-18 — End: 2023-07-18
  Filled 2023-05-18: qty 30, 30d supply, fill #0
  Filled 2023-06-19: qty 30, 30d supply, fill #1

## 2023-05-18 MED ORDER — CYANOCOBALAMIN 1000 MCG/ML IJ SOLN
1000.0000 ug | INTRAMUSCULAR | 1 refills | Status: DC
Start: 2023-05-18 — End: 2024-08-09
  Filled 2023-05-18: qty 3, 90d supply, fill #0
  Filled 2023-09-18: qty 3, 90d supply, fill #1
  Filled 2023-12-21: qty 3, 90d supply, fill #2
  Filled 2024-04-19: qty 3, 90d supply, fill #3

## 2023-05-18 MED ORDER — BUPROPION HCL ER (XL) 300 MG PO TB24
300.0000 mg | ORAL_TABLET | Freq: Every morning | ORAL | 3 refills | Status: DC
Start: 2023-05-18 — End: 2024-08-09
  Filled 2023-05-18: qty 90, fill #0
  Filled 2023-07-10: qty 90, 90d supply, fill #0
  Filled 2023-10-16: qty 90, 90d supply, fill #1
  Filled 2024-01-18: qty 90, 90d supply, fill #2
  Filled 2024-04-19: qty 90, 90d supply, fill #3

## 2023-05-18 NOTE — Patient Instructions (Addendum)
It was a pleasure meeting you today. Thank you for allowing me to take part in your health care.  Our goals for today as we discussed include:  Refills sent for medications  Start Phentermine 37.5 mg daily Follow up in 2 months  Look at [facility] website to see if this may be an option for you with your weight loss. [facility] [address] [place] [phone]    We will get some labs today.  If they are abnormal or we need to do something about them, I will call you.  If they are normal, I will send you a message on MyChart (if it is active) or a letter in the mail.  If you don't hear from [place] in 2 weeks, please call the office at the number below.    If you have any questions or concerns, please do not hesitate to call the office at [phone].  I look forward to our next visit and until then take care and stay safe.  Regards,   Dana Allan, MD   [facility]

## 2023-05-18 NOTE — Progress Notes (Signed)
SUBJECTIVE:   Chief Complaint  Patient presents with   Medical Management of Chronic Issues    6 month follow up    HPI Patient presents to clinic for follow up chronic disease management.  No acute concerns today.  Hypothyroid/Hashimoto's Thyroiditis Requesting refill. Doing well. Remains asymptomatic. Takes Levothyroxine 112 mcg S/M/T/Th/F/Sa, 168 mcg Wednesdays.   Weight management Patient continues to have ongoing fluctuations in weight.  Short term use of Phentermine had been successful but once discontinued weight increased.  Was not open to Qsymia given the topiramate side effects. Discussed higher success rate with combo medication vs Phentermine but would prefer higher dose of single medication.   No contraindication for GLP1, however family history of hashimoto's thyroid removal in sister for precancerous cells, not medullary.  Would err on side of caution.  Hyperlipidemia Currently on statin therapy and tolerating well. Requesting refill.  Recently seen Dr Rennis Golden, does not qualify for PSK9i at this point.  Recommended continued low dose statin and follow up as needed,    Mood disorder Requesting refill. Doing well.  Has been on Wellbutrin 300 mg daily and Buspar 10 mg BID as needed.   Denies any SI/HI.  Not followed by psychiatry.    Vitamin B 12/Vitam D deficiency Requesting refill of supplement.  PERTINENT PMH / PSH: Hypercholesterolemia Mood disorder Hypothyroidism Obesity class II Family hx early heart disease in both parents  OBJECTIVE:  BP 112/78   Pulse 71   Temp 98.2 F (36.8 C) (Oral)   Ht 5\' 9"  (1.753 m)   Wt 219 lb (99.3 kg)   SpO2 99%   BMI 32.34 kg/m    Physical Exam Vitals reviewed.  Constitutional:      General: She is not in acute distress.    Appearance: Normal appearance. She is normal weight. She is not ill-appearing, toxic-appearing or diaphoretic.  Eyes:     General:        Right eye: No discharge.        Left eye: No discharge.      Conjunctiva/sclera: Conjunctivae normal.  Neck:     Thyroid: No thyromegaly or thyroid tenderness.  Cardiovascular:     Rate and Rhythm: Normal rate and regular rhythm.     Heart sounds: Normal heart sounds.  Pulmonary:     Effort: Pulmonary effort is normal.     Breath sounds: Normal breath sounds.  Abdominal:     General: Bowel sounds are normal.  Musculoskeletal:        General: Normal range of motion.  Skin:    General: Skin is warm and dry.  Neurological:     General: No focal deficit present.     Mental Status: She is alert and oriented to person, place, and time. Mental status is at baseline.  Psychiatric:        Mood and Affect: Mood normal.        Behavior: Behavior normal.        Thought Content: Thought content normal.        Judgment: Judgment normal.     ASSESSMENT/PLAN:  Mood disorder (HCC) Assessment & Plan: Chronic.  Asymptomatic.  Doing well on current medications.  Denies any SI/HI. Refill Wellbutrin XL 300 mg daily Refill BuSpar 10 mg twice daily prn Encourage CBT  Strict return precautions provided.  Orders: -     buPROPion HCl ER (XL); Take 1 tablet (300 mg total) by mouth every morning.  Dispense: 90 tablet; Refill: 3 -  busPIRone HCl; Take 1 tablet (10 mg total) by mouth 2 (two) times daily as needed.  Dispense: 180 tablet; Refill: 3  Hypothyroidism, unspecified type Assessment & Plan: Chronic.  Asymptomatic.  Tolerating medication well. No indication for continued TPO, T4 labs Check TSH Refill Synthroid 112 mcg 6 days a week with 168 mcg on Wednesdays.  Equates to 120 mcg daily.  Orders: -     TSH -     T4, free -     Levothyroxine Sodium; Take 1 tablet (112 mcg total) by mouth every morning and 1.5 tablets by mouth on Wednesdays.  Dispense: 100 tablet; Refill: 3  B12 deficiency Assessment & Plan: Chronic.  No history of malabsorption issues/gastric surgery.  Offered sublingual, preferred to continue with monthly injection Refill  Vitamin B 12 1000 mcg monthly Check B 12 level   Orders: -     Vitamin B12 -     Cyanocobalamin; Inject 1 mL (1,000 mcg total) into the muscle every 30 (thirty) days.  Dispense: 12 mL; Refill: 1 -     Needle (Disp); 1 Device by Does not apply route every 30 (thirty) days.  Dispense: 12 each; Refill: 1  Mixed hyperlipidemia Assessment & Plan: Chronic.  Recent LDL improved.  Elevated LP(a) 179.3  Cardiology recommends continued low dose statin and plan to follow as needed Refill Crestor 5 mg daily Continue lifestyle management Check fasting lipid annually   Vitamin D deficiency -     VITAMIN D 25 Hydroxy (Vit-D Deficiency, Fractures)  Other stimulant use, unspecified, uncomplicated Assessment & Plan: New prescription for Phentermine use for weight loss management   Orders: -     Phentermine HCl; Take 1 tablet (37.5 mg total) by mouth daily before breakfast.  Dispense: 30 tablet; Refill: 1 -     ToxASSURE Select 13 (MW), Urine  Hyperlipidemia, unspecified hyperlipidemia type Assessment & Plan: Chronic.  Recent LDL improved.  Elevated LP(a) 179.3  Cardiology recommends continued low dose statin and plan to follow as needed Refill Crestor 5 mg daily Continue lifestyle management Check fasting lipid annually  Orders: -     Rosuvastatin Calcium; Take 1 tablet (5 mg total) by mouth daily.  Dispense: 90 tablet; Refill: 3 -     Lipid panel  Obesity (BMI 30-39.9) Assessment & Plan: Chronic. Not wanting to start Qsymia No contraindication for Phentermine Discussed risks of HTN, palpitations,ischemia, MI, increased anxiety, agitation etc. Ok with starting treatment Discussed healthy diet and portion control Offered referral to [facility], declined for now Discussed that Phentermine was short term use and cannot exceed >12 weeks. Non opioid contract and UDS obtained today Follow up in 2 months  Orders: -     Phentermine HCl; Take 1 tablet (37.5 mg total) by  mouth daily before breakfast.  Dispense: 30 tablet; Refill: 1 -     Comprehensive metabolic panel   PDMP reviewed  Return in about 2 months (around 07/18/2023) for PCP.  Dana Allan, MD

## 2023-05-24 LAB — TOXASSURE SELECT 13 (MW), URINE

## 2023-05-26 ENCOUNTER — Encounter: Payer: Self-pay | Admitting: Family Medicine

## 2023-05-27 ENCOUNTER — Encounter: Payer: Self-pay | Admitting: Family Medicine

## 2023-05-27 DIAGNOSIS — F1590 Other stimulant use, unspecified, uncomplicated: Secondary | ICD-10-CM | POA: Insufficient documentation

## 2023-05-27 DIAGNOSIS — E559 Vitamin D deficiency, unspecified: Secondary | ICD-10-CM | POA: Insufficient documentation

## 2023-05-27 MED ORDER — NEEDLE (DISP) 25G X 1-1/2" MISC
1.0000 | 1 refills | Status: DC
Start: 2023-05-27 — End: 2024-08-09
  Filled 2023-05-27 – 2023-09-18 (×2): qty 12, fill #0
  Filled 2024-04-19: qty 3, 90d supply, fill #0

## 2023-05-27 MED ORDER — BUSPIRONE HCL 10 MG PO TABS
10.0000 mg | ORAL_TABLET | Freq: Two times a day (BID) | ORAL | 3 refills | Status: DC | PRN
Start: 2023-05-27 — End: 2024-07-16
  Filled 2023-05-27 – 2023-07-10 (×2): qty 180, 90d supply, fill #0
  Filled 2024-01-18: qty 180, 90d supply, fill #1

## 2023-05-27 NOTE — Assessment & Plan Note (Addendum)
Chronic. Not wanting to start Qsymia No contraindication for Phentermine Discussed risks of HTN, palpitations,ischemia, MI, increased anxiety, agitation etc. Ok with starting treatment Discussed healthy diet and portion control Offered referral to [facility], declined for now Discussed that Phentermine was short term use and cannot exceed >12 weeks. Non opioid contract and UDS obtained today Follow up in 2 months

## 2023-05-27 NOTE — Assessment & Plan Note (Signed)
Chronic.  Asymptomatic.  Tolerating medication well. No indication for continued TPO, T4 labs Check TSH Refill Synthroid 112 mcg 6 days a week with 168 mcg on Wednesdays.  Equates to 120 mcg daily.

## 2023-05-27 NOTE — Assessment & Plan Note (Signed)
New prescription for Phentermine use for weight loss management

## 2023-05-27 NOTE — Assessment & Plan Note (Addendum)
Chronic.  Asymptomatic.  Doing well on current medications.  Denies any SI/HI. Refill Wellbutrin XL 300 mg daily Refill BuSpar 10 mg twice daily prn Encourage CBT  Strict return precautions provided.

## 2023-05-27 NOTE — Assessment & Plan Note (Signed)
Chronic.  Recent LDL improved.  Elevated LP(a) 179.3  Cardiology recommends continued low dose statin and plan to follow as needed Refill Crestor 5 mg daily Continue lifestyle management Check fasting lipid annually

## 2023-05-27 NOTE — Assessment & Plan Note (Signed)
Vitamin D normal Can resume Vitamin D 200IU daily

## 2023-05-27 NOTE — Assessment & Plan Note (Signed)
Chronic.  No history of malabsorption issues/gastric surgery.  Offered sublingual, preferred to continue with monthly injection Refill Vitamin B 12 1000 mcg monthly Check B 12 level

## 2023-05-28 ENCOUNTER — Other Ambulatory Visit: Payer: Self-pay

## 2023-05-29 ENCOUNTER — Other Ambulatory Visit: Payer: Self-pay

## 2023-06-07 ENCOUNTER — Other Ambulatory Visit: Payer: Self-pay | Admitting: Family Medicine

## 2023-06-07 ENCOUNTER — Other Ambulatory Visit: Payer: Self-pay

## 2023-06-07 ENCOUNTER — Other Ambulatory Visit: Payer: Self-pay | Admitting: Oncology

## 2023-06-07 DIAGNOSIS — Z006 Encounter for examination for normal comparison and control in clinical research program: Secondary | ICD-10-CM

## 2023-06-07 DIAGNOSIS — R7989 Other specified abnormal findings of blood chemistry: Secondary | ICD-10-CM

## 2023-06-08 ENCOUNTER — Other Ambulatory Visit: Payer: Self-pay | Admitting: Family Medicine

## 2023-06-08 ENCOUNTER — Other Ambulatory Visit: Payer: Self-pay

## 2023-06-08 DIAGNOSIS — R7989 Other specified abnormal findings of blood chemistry: Secondary | ICD-10-CM

## 2023-06-09 ENCOUNTER — Other Ambulatory Visit: Payer: Self-pay

## 2023-06-12 ENCOUNTER — Other Ambulatory Visit: Payer: Self-pay

## 2023-06-19 ENCOUNTER — Other Ambulatory Visit: Payer: Self-pay

## 2023-06-23 DIAGNOSIS — L821 Other seborrheic keratosis: Secondary | ICD-10-CM | POA: Diagnosis not present

## 2023-06-23 DIAGNOSIS — D2372 Other benign neoplasm of skin of left lower limb, including hip: Secondary | ICD-10-CM | POA: Diagnosis not present

## 2023-07-10 ENCOUNTER — Other Ambulatory Visit: Payer: Self-pay

## 2023-07-18 ENCOUNTER — Ambulatory Visit: Payer: 59 | Admitting: Family Medicine

## 2023-07-18 ENCOUNTER — Other Ambulatory Visit: Payer: Self-pay

## 2023-07-18 VITALS — BP 120/76 | HR 92 | Temp 97.6°F | Resp 16 | Ht 69.0 in | Wt 210.4 lb

## 2023-07-18 DIAGNOSIS — F39 Unspecified mood [affective] disorder: Secondary | ICD-10-CM | POA: Diagnosis not present

## 2023-07-18 DIAGNOSIS — E669 Obesity, unspecified: Secondary | ICD-10-CM | POA: Diagnosis not present

## 2023-07-18 DIAGNOSIS — F1590 Other stimulant use, unspecified, uncomplicated: Secondary | ICD-10-CM

## 2023-07-18 DIAGNOSIS — Z6831 Body mass index (BMI) 31.0-31.9, adult: Secondary | ICD-10-CM

## 2023-07-18 MED ORDER — PHENTERMINE HCL 37.5 MG PO TABS
37.5000 mg | ORAL_TABLET | Freq: Every day | ORAL | 3 refills | Status: DC
Start: 2023-07-18 — End: 2023-11-20
  Filled 2023-07-18 (×2): qty 30, 30d supply, fill #0
  Filled 2023-08-18: qty 30, 30d supply, fill #1
  Filled 2023-09-18: qty 30, 30d supply, fill #2
  Filled 2023-10-16: qty 30, 30d supply, fill #3

## 2023-07-18 NOTE — Assessment & Plan Note (Signed)
Chronic.  Asymptomatic.    Denies any SI/HI. Continue Wellbutrin XL 300 mg daily Continue BuSpar 10 mg twice daily prn Encourage CBT

## 2023-07-18 NOTE — Progress Notes (Signed)
SUBJECTIVE:   Chief Complaint  Patient presents with   Medical Management of Chronic Issues   HPI Patient presents to clinic for follow up chronic disease management.  No acute concerns today.  Weight management Doing well on current dose of Phentermine. Weight down 9lbs since last visit.  Denies any headaches, chest pain, heart palpitations, shortness of breath.  BP remains normal.   Has been working on changing diet and increasing activity.  Recently had deviated from plan as was busy with closing on house and building new home.  Plans to return back to healthy lifestyle in near future.  Mood disorder Doing well.  Has been on Wellbutrin 300 mg daily and Buspar 10 mg BID as needed.   Denies any SI/HI.    PERTINENT PMH / PSH: Hypercholesterolemia Mood disorder Hypothyroidism Obesity class I Family hx early heart disease in both parents  OBJECTIVE:  BP 120/76   Pulse 92   Temp 97.6 F (36.4 C)   Resp 16   Ht 5\' 9"  (1.753 m)   Wt 210 lb 6 oz (95.4 kg)   SpO2 98%   BMI 31.07 kg/m    Physical Exam Vitals reviewed.  Constitutional:      General: She is not in acute distress.    Appearance: Normal appearance. She is obese. She is not ill-appearing, toxic-appearing or diaphoretic.  Eyes:     General:        Right eye: No discharge.        Left eye: No discharge.     Conjunctiva/sclera: Conjunctivae normal.  Cardiovascular:     Rate and Rhythm: Normal rate.  Pulmonary:     Effort: Pulmonary effort is normal.  Neurological:     Mental Status: She is alert and oriented to person, place, and time. Mental status is at baseline.  Psychiatric:        Mood and Affect: Mood normal.        Behavior: Behavior normal.        Thought Content: Thought content normal.        Judgment: Judgment normal.       07/18/2023    9:04 AM 05/18/2023    9:14 AM 03/23/2023   12:27 PM 01/02/2023   11:17 AM 11/16/2022    9:47 AM  Depression screen PHQ 2/9  Decreased Interest 1 1 1 1 1    Down, Depressed, Hopeless 0 1 1 1 1   PHQ - 2 Score 1 2 2 2 2   Altered sleeping 1 0 1 0 0  Tired, decreased energy 1 1 1 1 1   Change in appetite 0 0 0 0 0  Feeling bad or failure about yourself  1 1 1 1 2   Trouble concentrating 1 1 1 1 1   Moving slowly or fidgety/restless 0 0 0 0 0  Suicidal thoughts 0 0 0 0 0  PHQ-9 Score 5 5 6 5 6   Difficult doing work/chores Not difficult at all Not difficult at all Somewhat difficult Not difficult at all Somewhat difficult       07/18/2023    9:04 AM 05/18/2023    9:14 AM 03/23/2023   12:28 PM 01/02/2023   11:18 AM  GAD 7 : Generalized Anxiety Score  Nervous, Anxious, on Edge 1 1 0 0  Control/stop worrying 0 0 0 0  Worry too much - different things 0 1 0 0  Trouble relaxing 1 2 0 0  Restless 0 0 0 0  Easily annoyed or  irritable 1 1 0 0  Afraid - awful might happen 0 0 0 0  Total GAD 7 Score 3 5 0 0  Anxiety Difficulty Not difficult at all Somewhat difficult Not difficult at all Not difficult at all     ASSESSMENT/PLAN:  Mood disorder [facility]) Assessment & Plan: Chronic.  Asymptomatic.    Denies any SI/HI. Continue Wellbutrin XL 300 mg daily Continue BuSpar 10 mg twice daily prn Encourage CBT     Obesity (BMI 30-39.9) Assessment & Plan: Chronic. Weight down 9 lbs Refill Phentermine 37.5 mg daily x 30 tabs.  3 refills Non opioid contract previously signed Continue healthy lifestyle and increase in activity Resources provided for other options for long term weight los management Follow up in 4 months  Orders: -     Phentermine HCl; Take 1 tablet (37.5 mg total) by mouth daily before breakfast.  Dispense: 30 tablet; Refill: 3  Other stimulant use, unspecified, uncomplicated -     Phentermine HCl; Take 1 tablet (37.5 mg total) by mouth daily before breakfast.  Dispense: 30 tablet; Refill: 3    PDMP reviewed  Return in about 4 months (around 11/17/2023) for PCP.  Dana Allan, MD

## 2023-07-18 NOTE — Assessment & Plan Note (Signed)
Chronic. Weight down 9 lbs Refill Phentermine 37.5 mg daily x 30 tabs.  3 refills Non opioid contract previously signed Continue healthy lifestyle and increase in activity Resources provided for other options for long term weight los management Follow up in 4 months

## 2023-07-18 NOTE — Patient Instructions (Addendum)
It was a pleasure meeting you today. Thank you for allowing me to take part in your health care.  Our goals for today as we discussed include:  For more options for weight loss management can look into  [facility] MD [facility] [place] [facility] [address] [place]  [phone]  Look at [facility] website to see if this may be an option for you with your weight loss. [facility] [address] [place] [phone]    Refill sent for Phentermine for 3 months Continue healthy lifestyle changes  Follow up 4 months  If you have any questions or concerns, please do not hesitate to call the office at [phone].  I look forward to our next visit and until then take care and stay safe.  Regards,   Dana Allan, MD   [facility]

## 2023-08-07 ENCOUNTER — Other Ambulatory Visit: Payer: Self-pay

## 2023-09-18 ENCOUNTER — Other Ambulatory Visit: Payer: Self-pay

## 2023-09-21 ENCOUNTER — Other Ambulatory Visit: Payer: Self-pay

## 2023-09-21 MED ORDER — FLULAVAL 0.5 ML IM SUSY
PREFILLED_SYRINGE | INTRAMUSCULAR | 0 refills | Status: DC
  Filled 2023-09-21: qty 0.5, 1d supply, fill #0

## 2023-10-16 ENCOUNTER — Other Ambulatory Visit: Payer: Self-pay

## 2023-10-26 ENCOUNTER — Other Ambulatory Visit: Payer: Self-pay | Admitting: Obstetrics and Gynecology

## 2023-10-26 DIAGNOSIS — Z3041 Encounter for surveillance of contraceptive pills: Secondary | ICD-10-CM

## 2023-10-29 ENCOUNTER — Other Ambulatory Visit: Payer: Self-pay | Admitting: Obstetrics and Gynecology

## 2023-10-29 ENCOUNTER — Other Ambulatory Visit: Payer: Self-pay

## 2023-10-29 DIAGNOSIS — Z3041 Encounter for surveillance of contraceptive pills: Secondary | ICD-10-CM

## 2023-10-30 ENCOUNTER — Other Ambulatory Visit: Payer: Self-pay

## 2023-10-30 MED FILL — Drospirenone-Ethinyl Estradiol Tab 3-0.02 MG: ORAL | 84 days supply | Qty: 84 | Fill #0 | Status: AC

## 2023-11-20 ENCOUNTER — Other Ambulatory Visit: Payer: Self-pay

## 2023-11-20 ENCOUNTER — Ambulatory Visit (INDEPENDENT_AMBULATORY_CARE_PROVIDER_SITE_OTHER): Payer: 59 | Admitting: Family Medicine

## 2023-11-20 ENCOUNTER — Encounter: Payer: Self-pay | Admitting: Family Medicine

## 2023-11-20 VITALS — BP 110/72 | HR 86 | Temp 97.9°F | Resp 18 | Ht 68.75 in | Wt 209.1 lb

## 2023-11-20 DIAGNOSIS — F1590 Other stimulant use, unspecified, uncomplicated: Secondary | ICD-10-CM

## 2023-11-20 DIAGNOSIS — E669 Obesity, unspecified: Secondary | ICD-10-CM

## 2023-11-20 DIAGNOSIS — E039 Hypothyroidism, unspecified: Secondary | ICD-10-CM | POA: Diagnosis not present

## 2023-11-20 DIAGNOSIS — F39 Unspecified mood [affective] disorder: Secondary | ICD-10-CM | POA: Diagnosis not present

## 2023-11-20 LAB — TSH: TSH: 1.17 u[IU]/mL (ref 0.35–5.50)

## 2023-11-20 MED ORDER — PHENTERMINE HCL 37.5 MG PO TABS
37.5000 mg | ORAL_TABLET | Freq: Every day | ORAL | 3 refills | Status: DC
  Filled 2023-11-20: qty 30, 30d supply, fill #0
  Filled 2023-12-21: qty 30, 30d supply, fill #1
  Filled 2024-01-18: qty 30, 30d supply, fill #2
  Filled 2024-02-15 – 2024-02-16 (×2): qty 30, 30d supply, fill #3

## 2023-11-20 MED ORDER — LEVOTHYROXINE SODIUM 112 MCG PO TABS
112.0000 ug | ORAL_TABLET | Freq: Every morning | ORAL | 3 refills | Status: DC
  Filled 2023-11-20: qty 100, 90d supply, fill #0
  Filled 2024-02-15: qty 100, 90d supply, fill #1
  Filled 2024-05-21: qty 100, 90d supply, fill #2

## 2023-11-20 NOTE — Progress Notes (Unsigned)
SUBJECTIVE:   Chief Complaint  Patient presents with   Mood disorder    4 month follow up   HPI Presents for follow up weight loss  Discussed the use of AI scribe software for clinical note transcription with the patient, who gave verbal consent to proceed.  History of Present Illness The patient, with a history of hypothyroidism and weight management issues, presents for a routine check-up and medication refill. She reports stability in her mood and overall health, with no new symptoms or concerns. She has been adhering to her prescribed medication regimen, which includes a thyroid medication and phentermine for weight management.  The patient reports that the phentermine has helped maintain her weight over the past six months, despite a period of significant life stress related to building and moving into a new house. She acknowledges a lack of regular exercise during this period, which she identifies as a potential factor in her weight stability rather than loss.  In addition to her weight management concerns, the patient has noticed changes in her menstrual cycle and mood over the past six to nine months. She reports intermittent spotting, even while on birth control, and increased irritability and teariness around the time of her period. She also mentions occasional facial flushing, which occurs randomly and is not associated with medication intake or other identifiable triggers.  The patient expresses a desire to improve her overall health and is open to exploring additional resources, such as a referral to a [facility] program. She also expresses interest in learning more about nutrition, particularly in relation to her hypothyroidism. She reports a family history of bariatric surgeries and acknowledges that she may never be a "super tiny person," but she is committed to making healthy choices and maintaining a balanced diet.    PERTINENT PMH / PSH: As  above  OBJECTIVE:  BP 110/72   Pulse 86   Temp 97.9 F (36.6 C)   Resp 18   Ht 5' 8.75" (1.746 m)   Wt 209 lb 2 oz (94.9 kg)   SpO2 99%   BMI 31.11 kg/m    Physical Exam Vitals reviewed.  Constitutional:      General: She is not in acute distress.    Appearance: Normal appearance. She is normal weight. She is not ill-appearing, toxic-appearing or diaphoretic.  Eyes:     General:        Right eye: No discharge.        Left eye: No discharge.     Conjunctiva/sclera: Conjunctivae normal.  Cardiovascular:     Rate and Rhythm: Normal rate and regular rhythm.     Heart sounds: Normal heart sounds.  Pulmonary:     Effort: Pulmonary effort is normal.     Breath sounds: Normal breath sounds.  Musculoskeletal:        General: Normal range of motion.  Skin:    General: Skin is warm and dry.  Neurological:     Mental Status: She is alert and oriented to person, place, and time. Mental status is at baseline.  Psychiatric:        Mood and Affect: Mood normal.        Behavior: Behavior normal.        Thought Content: Thought content normal.        Judgment: Judgment normal.        11/20/2023    8:55 AM 07/18/2023    9:04 AM 05/18/2023    9:14 AM 03/23/2023  12:27 PM 01/02/2023   11:17 AM  Depression screen PHQ 2/9  Decreased Interest 1 1 1 1 1   Down, Depressed, Hopeless 0 0 1 1 1   PHQ - 2 Score 1 1 2 2 2   Altered sleeping 0 1 0 1 0  Tired, decreased energy 1 1 1 1 1   Change in appetite 0 0 0 0 0  Feeling bad or failure about yourself  0 1 1 1 1   Trouble concentrating 1 1 1 1 1   Moving slowly or fidgety/restless 0 0 0 0 0  Suicidal thoughts 0 0 0 0 0  PHQ-9 Score 3 5 5 6 5   Difficult doing work/chores Not difficult at all Not difficult at all Not difficult at all Somewhat difficult Not difficult at all      11/20/2023    8:55 AM 07/18/2023    9:04 AM 05/18/2023    9:14 AM 03/23/2023   12:28 PM  GAD 7 : Generalized Anxiety Score  Nervous, Anxious, on Edge 1 1 1  0   Control/stop worrying 0 0 0 0  Worry too much - different things 0 0 1 0  Trouble relaxing 2 1 2  0  Restless 1 0 0 0  Easily annoyed or irritable 1 1 1  0  Afraid - awful might happen 0 0 0 0  Total GAD 7 Score 5 3 5  0  Anxiety Difficulty Not difficult at all Not difficult at all Somewhat difficult Not difficult at all    ASSESSMENT/PLAN:  Obesity (BMI 30-39.9) Assessment & Plan: Stable weight on phentermine, but no significant weight loss. She reports maintaining healthy eating habits and acknowledges need for increased exercise.   -Refill phentermine 37.5 mg daily for 3 months.  Again discussed that this was not beneficial for long term management of weight loss -Refer to [facility] for further support and potential adjustment of weight loss strategy.   Orders: -     Phentermine HCl; Take 1 tablet (37.5 mg total) by mouth daily before breakfast.  Dispense: 30 tablet; Refill: 3  Mood disorder (HCC) Assessment & Plan: Managed well on current medication Continue Wellbutrin XL 300 mg daily Continue BuSpar 10 mg twice daily prn Encourage CBT     Hypothyroidism, unspecified type Assessment & Plan: Last TSH was 0.49 on alternating doses of levothyroxine (1 tablet and 1.5 tablets).   -Check TSH today to ensure therapeutic dosing.    Orders: -     Levothyroxine Sodium; Take 1 tablet (112 mcg total) by mouth every morning and 1.5 tablets by mouth on Wednesdays.  Dispense: 100 tablet; Refill: 3 -     TSH  Other stimulant use, unspecified, uncomplicated -     Phentermine HCl; Take 1 tablet (37.5 mg total) by mouth daily before breakfast.  Dispense: 30 tablet; Refill: 3    PDMP reviewed  Return in about 3 months (around 02/18/2024) for PCP, weight management.  Dana Allan, MD

## 2023-11-20 NOTE — Patient Instructions (Addendum)
It was a pleasure meeting you today. Thank you for allowing me to take part in your health care.  Our goals for today as we discussed include:  Refills sent for requested medications  Referral sent to [facility] [id number] [place] [phone]    This is a list of the screening recommended for you and due dates:  Health Maintenance  Topic Date Due   COVID-19 Vaccine (3 - 2024-25 season) 07/30/2023   DTaP/Tdap/Td vaccine (9 - Td or Tdap) 02/28/2026   Pap with HPV screening  08/31/2026   Flu Shot  Completed   Hepatitis C Screening  Completed   HIV Screening  Completed   HPV Vaccine  Aged Out     Follow up in 3 months   If you have any questions or concerns, please do not hesitate to call the office at [phone].  I look forward to our next visit and until then take care and stay safe.  Regards,   Dana Allan, MD   [facility]

## 2023-11-23 ENCOUNTER — Encounter: Payer: Self-pay | Admitting: Family Medicine

## 2023-11-23 NOTE — Assessment & Plan Note (Signed)
Managed well on current medication Continue Wellbutrin XL 300 mg daily Continue BuSpar 10 mg twice daily prn Encourage CBT

## 2023-11-23 NOTE — Assessment & Plan Note (Signed)
Stable weight on phentermine, but no significant weight loss. She reports maintaining healthy eating habits and acknowledges need for increased exercise.   -Refill phentermine 37.5 mg daily for 3 months.  Again discussed that this was not beneficial for long term management of weight loss -Refer to [facility] for further support and potential adjustment of weight loss strategy.

## 2023-11-23 NOTE — Assessment & Plan Note (Signed)
Last TSH was 0.49 on alternating doses of levothyroxine (1 tablet and 1.5 tablets).   -Check TSH today to ensure therapeutic dosing.

## 2023-11-30 NOTE — Progress Notes (Deleted)
 PCP:  Hope Merle, MD   No chief complaint on file.   HPI:      [name] is a 38 y.o. [phone] who LMP was No LMP recorded. (Menstrual status: Oral contraceptives)., presents today for her annual examination.  Her menses are occas on OCPs, and occur only on placebo pills now, lasting 3-4 days, light flow/spotting, occas dysmen. BTB resolved if pt stays on same generic Rx from pharmacy.   Sex activity: single partner, contraception - OCP (estrogen/progesterone ). Dyspareunia sx improved.  Last Pap: 08/31/21  Results were: no abnormalities /neg HPV DNA Hx of STDs: none  There is no FH of breast cancer. There is no FH of ovarian cancer. The patient does do self-breast exams.  Tobacco use: The patient denies current or previous tobacco use. Alcohol use: rare No drug use.  Exercise: min active  She does get adequate calcium  and Vitamin D  in her diet.  Labs with PCP.   Has anxiety/depression, doing pretty well with wellbutrin  and buspar  combination. Managed by PCP.    Past Medical History:  Diagnosis Date   Annual physical exam 04/09/2020   Anxiety    COVID-19    10/2019 11/2020   Depression    External hemorrhoid 06/11/2019   Hashimoto's thyroiditis 04/29/2022   Hyperhidrosis 01/05/2018   Hypothyroidism    Iron deficiency    Iron deficiency 08/30/2018   Obesity    Thyroid  disease     Past Surgical History:  Procedure Laterality Date   CESAREAN SECTION  07/2008   CESAREAN SECTION      Family History  Problem Relation Age of Onset   Hyperlipidemia Mother    Depression Mother    Hypertension Mother    Hypothyroidism Mother    Thyroid  disease Mother    Hypertension Father    Diabetes Father    Asthma Father    Heart disease Father    Hyperlipidemia Father    Lupus Sister    Hypothyroidism Sister        hashimotos thyroid  removal for precancerous cells   Asthma Sister    Miscarriages / Stillbirths Sister    Depression Son    Heart disease  Maternal Grandmother    Dementia Maternal Grandmother    Thyroid  disease Maternal Grandmother    Thyroid  disease Maternal Grandfather    Colon cancer Maternal Grandfather 62   Melanoma Maternal Grandfather 90   Cancer Paternal Grandfather        skin     Social History   Socioeconomic History   Marital status: Married    Spouse name: Not on file   Number of children: 1   Years of education: Not on file   Highest education level: Some college, no degree  Occupational History   Not on file  Tobacco Use   Smoking status: Former   Smokeless tobacco: Never   Tobacco comments:    quit smoking 2009   Vaping Use   Vaping status: Never Used  Substance and Sexual Activity   Alcohol use: Yes   Drug use: No   Sexual activity: Yes    Birth control/protection: Pill  Other Topics Concern   Not on file  Social History Narrative   Wears seat belt   Feels safe in relationship    3 kids 2 bio and 1 step son    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      Social Drivers of Health   Financial Resource Strain: Low Risk  (  07/18/2023)   Overall Financial Resource Strain (CARDIA)    Difficulty of Paying Living Expenses: Not hard at all  Food Insecurity: No Food Insecurity (07/18/2023)   Hunger Vital Sign    Worried About Running Out of Food in the Last Year: Never true    Ran Out of Food in the Last Year: Never true  Transportation Needs: No Transportation Needs (07/18/2023)   PRAPARE - Administrator, Civil Service (Medical): No    Lack of Transportation (Non-Medical): No  Physical Activity: Insufficiently Active (07/18/2023)   Exercise Vital Sign    Days of Exercise per Week: 3 days    Minutes of Exercise per Session: 30 min  Stress: Stress Concern Present (07/18/2023)   [employer] of Occupational Health - Occupational Stress Questionnaire    Feeling of Stress : To some extent  Social Connections: Socially Integrated (07/18/2023)   Social Connection and Isolation Panel  [NHANES]    Frequency of Communication with Friends and Family: More than three times a week    Frequency of Social Gatherings with Friends and Family: Not on file    Attends Religious Services: More than 4 times per year    Active Member of [employer] or Organizations: Yes    Attends Banker Meetings: More than 4 times per year    Marital Status: Married  Catering Manager Violence: Not At Risk (01/30/2018)   Humiliation, Afraid, Rape, and Kick questionnaire    Fear of Current or Ex-Partner: No    Emotionally Abused: No    Physically Abused: No    Sexually Abused: No    Outpatient Medications Prior to Visit  Medication Sig Dispense Refill   buPROPion  (WELLBUTRIN  XL) 300 MG 24 hr tablet Take 1 tablet (300 mg total) by mouth every morning. 90 tablet 3   busPIRone  (BUSPAR ) 10 MG tablet Take 1 tablet (10 mg total) by mouth 2 (two) times daily as needed. 180 tablet 3   cyanocobalamin  (VITAMIN B12) 1000 MCG/ML injection Inject 1 mL (1,000 mcg total) into the muscle every 30 (thirty) days. 12 mL 1   drospirenone -ethinyl estradiol  (JASMIEL ) 3-0.02 MG tablet Take 1 tablet by mouth daily. 84 tablet 0   FIBER ADULT GUMMIES PO Take by mouth.     influenza vac split trivalent PF (FLULAVAL ) 0.5 ML injection Inject into the muscle. 0.5 mL 0   levothyroxine  (SYNTHROID ) 112 MCG tablet Take 1 tablet (112 mcg total) by mouth every morning and 1.5 tablets by mouth on Wednesdays. 100 tablet 3   Multiple Vitamins-Minerals (MULTIPLE VITAMINS/WOMENS PO) Take by mouth.     NEEDLE, DISP, 25 G 25G X 1-1/2 MISC 1 Device by Does not apply route every 30 (thirty) days. 12 each 1   phentermine  (ADIPEX-P ) 37.5 MG tablet Take 1 tablet (37.5 mg total) by mouth daily before breakfast. 30 tablet 3   rosuvastatin  (CRESTOR ) 5 MG tablet Take 1 tablet (5 mg total) by mouth daily. 90 tablet 3   tiZANidine  (ZANAFLEX ) 2 MG tablet Take 1 tablet (2 mg total) by mouth at bedtime as needed for muscle spasms. 30 tablet 2    triamcinolone  cream (KENALOG ) 0.1 % Apply twice daily to bites as needed. 45 g 3   VITAMIN D  PO Take 200 Int'l Units/day by mouth.     No facility-administered medications prior to visit.    ROS:  Review of Systems  Constitutional:  Negative for fatigue, fever and unexpected weight change.  Respiratory:  Negative for cough, shortness of  breath and wheezing.   Cardiovascular:  Negative for chest pain, palpitations and leg swelling.  Gastrointestinal:  Negative for blood in stool, constipation, diarrhea, nausea and vomiting.  Endocrine: Negative for cold intolerance, heat intolerance and polyuria.  Genitourinary:  Negative for dyspareunia, dysuria, flank pain, frequency, genital sores, hematuria, menstrual problem, pelvic pain, urgency, vaginal bleeding, vaginal discharge and vaginal pain.  Musculoskeletal:  Negative for back pain, joint swelling and myalgias.  Skin:  Negative for rash.  Neurological:  Negative for dizziness, syncope, light-headedness, numbness and headaches.  Hematological:  Negative for adenopathy.  Psychiatric/Behavioral:  Positive for agitation. Negative for confusion, dysphoric mood, sleep disturbance and suicidal ideas. The patient is not nervous/anxious.   BREAST: No symptoms   Objective: There were no vitals taken for this visit.   Physical Exam Constitutional:      Appearance: She is well-developed.  Genitourinary:     Vulva normal.     Right Labia: No rash, tenderness or lesions.    Left Labia: No tenderness, lesions or rash.    No vaginal discharge, erythema or tenderness.      Right Adnexa: not tender and no mass present.    Left Adnexa: not tender and no mass present.    No cervical friability or polyp.     Uterus is not enlarged or tender.  Rectum:     Guaiac result negative.     No anal fissure or external hemorrhoid.  Breasts:    Right: No mass, nipple discharge, skin change or tenderness.     Left: No mass, nipple discharge, skin change or  tenderness.  Neck:     Thyroid : No thyromegaly.  Cardiovascular:     Rate and Rhythm: Normal rate and regular rhythm.     Heart sounds: Normal heart sounds. No murmur heard. Pulmonary:     Effort: Pulmonary effort is normal.     Breath sounds: Normal breath sounds.  Abdominal:     Palpations: Abdomen is soft.     Tenderness: There is no abdominal tenderness. There is no guarding or rebound.  Musculoskeletal:        General: Normal range of motion.     Cervical back: Normal range of motion.  Lymphadenopathy:     Cervical: No cervical adenopathy.  Neurological:     General: No focal deficit present.     Mental Status: She is alert and oriented to person, place, and time.     Cranial Nerves: No cranial nerve deficit.  Skin:    General: Skin is warm and dry.  Psychiatric:        Mood and Affect: Mood normal.        Behavior: Behavior normal.        Thought Content: Thought content normal.        Judgment: Judgment normal.  Vitals reviewed.     Assessment/Plan: Encounter for annual routine gynecological examination  Encounter for surveillance of contraceptive pills - Plan: drospirenone -ethinyl estradiol  (YAZ) 3-0.02 MG tablet; OCP RF.   Breakthrough bleeding on OCPs--resolved if stays on same generic pills. F/u prn.    No orders of the defined types were placed in this encounter.        GYN counsel adequate intake of calcium  and vitamin D , diet and exercise     F/U  No follow-ups on file.  Collie Kittel B. Peyten Weare, PA-C 11/30/2023 12:02 PM

## 2023-12-04 ENCOUNTER — Ambulatory Visit: Payer: 59 | Admitting: Obstetrics and Gynecology

## 2023-12-04 DIAGNOSIS — Z3041 Encounter for surveillance of contraceptive pills: Secondary | ICD-10-CM

## 2023-12-04 DIAGNOSIS — Z01419 Encounter for gynecological examination (general) (routine) without abnormal findings: Secondary | ICD-10-CM

## 2023-12-21 ENCOUNTER — Other Ambulatory Visit: Payer: Self-pay

## 2024-01-02 NOTE — Progress Notes (Unsigned)
PCP:  Dana Allan, MD   No chief complaint on file.   HPI:      [name] is a 38 y.o. [phone] who LMP was No LMP recorded. (Menstrual status: Oral contraceptives)., presents today for her annual examination.  Her menses are occas on OCPs, and occur only on placebo pills now, lasting 3-4 days, light flow/spotting, occas dysmen. BTB resolved if pt stays on same generic Rx from pharmacy.   Sex activity: single partner, contraception - OCP (estrogen/progesterone). Dyspareunia sx improved.  Last Pap: 08/31/21  Results were: no abnormalities /neg HPV DNA Hx of STDs: none  There is no FH of breast cancer. There is no FH of ovarian cancer. The patient does do self-breast exams.  Tobacco use: The patient denies current or previous tobacco use. Alcohol use: rare No drug use.  Exercise: min active  She does get adequate calcium and Vitamin D in her diet.  Labs with PCP.   Has anxiety/depression, doing pretty well with wellbutrin and buspar combination. Managed by PCP.    Past Medical History:  Diagnosis Date   Annual physical exam 04/09/2020   Anxiety    COVID-19    10/2019 11/2020   Depression    External hemorrhoid 06/11/2019   Hashimoto's thyroiditis 04/29/2022   Hyperhidrosis 01/05/2018   Hypothyroidism    Iron deficiency    Iron deficiency 08/30/2018   Obesity    Thyroid disease     Past Surgical History:  Procedure Laterality Date   CESAREAN SECTION  07/2008   CESAREAN SECTION      Family History  Problem Relation Age of Onset   Hyperlipidemia Mother    Depression Mother    Hypertension Mother    Hypothyroidism Mother    Thyroid disease Mother    Hypertension Father    Diabetes Father    Asthma Father    Heart disease Father    Hyperlipidemia Father    Lupus Sister    Hypothyroidism Sister        hashimotos thyroid removal for precancerous cells   Asthma Sister    Miscarriages / Stillbirths Sister    Depression Son    Heart disease  Maternal Grandmother    Dementia Maternal Grandmother    Thyroid disease Maternal Grandmother    Thyroid disease Maternal Grandfather    Colon cancer Maternal Grandfather 81   Melanoma Maternal Grandfather 60   Cancer Paternal Grandfather        skin     Social History   Socioeconomic History   Marital status: Married    Spouse name: Not on file   Number of children: 1   Years of education: Not on file   Highest education level: Some college, no degree  Occupational History   Not on file  Tobacco Use   Smoking status: Former   Smokeless tobacco: Never   Tobacco comments:    quit smoking 2009   Vaping Use   Vaping status: Never Used  Substance and Sexual Activity   Alcohol use: Yes   Drug use: No   Sexual activity: Yes    Birth control/protection: Pill  Other Topics Concern   Not on file  Social History Narrative   Wears seat belt   Feels safe in relationship    3 kids 2 bio and 1 step son    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      Social Drivers of Health   Financial Resource Strain: Low Risk  (  07/18/2023)   Overall Financial Resource Strain (CARDIA)    Difficulty of Paying Living Expenses: Not hard at all  Food Insecurity: No Food Insecurity (07/18/2023)   Hunger Vital Sign    Worried About Running Out of Food in the Last Year: Never true    Ran Out of Food in the Last Year: Never true  Transportation Needs: No Transportation Needs (07/18/2023)   PRAPARE - Administrator, Civil Service (Medical): No    Lack of Transportation (Non-Medical): No  Physical Activity: Insufficiently Active (07/18/2023)   Exercise Vital Sign    Days of Exercise per Week: 3 days    Minutes of Exercise per Session: 30 min  Stress: Stress Concern Present (07/18/2023)   [employer] of Occupational Health - Occupational Stress Questionnaire    Feeling of Stress : To some extent  Social Connections: Socially Integrated (07/18/2023)   Social Connection and Isolation Panel  [NHANES]    Frequency of Communication with Friends and Family: More than three times a week    Frequency of Social Gatherings with Friends and Family: Not on file    Attends Religious Services: More than 4 times per year    Active Member of [employer] or Organizations: Yes    Attends Banker Meetings: More than 4 times per year    Marital Status: Married  Catering manager Violence: Not At Risk (01/30/2018)   Humiliation, Afraid, Rape, and Kick questionnaire    Fear of Current or Ex-Partner: No    Emotionally Abused: No    Physically Abused: No    Sexually Abused: No    Outpatient Medications Prior to Visit  Medication Sig Dispense Refill   buPROPion (WELLBUTRIN XL) 300 MG 24 hr tablet Take 1 tablet (300 mg total) by mouth every morning. 90 tablet 3   busPIRone (BUSPAR) 10 MG tablet Take 1 tablet (10 mg total) by mouth 2 (two) times daily as needed. 180 tablet 3   cyanocobalamin (VITAMIN B12) 1000 MCG/ML injection Inject 1 mL (1,000 mcg total) into the muscle every 30 (thirty) days. 12 mL 1   drospirenone-ethinyl estradiol (JASMIEL) 3-0.02 MG tablet Take 1 tablet by mouth daily. 84 tablet 0   FIBER ADULT GUMMIES PO Take by mouth.     influenza vac split trivalent PF (FLULAVAL) 0.5 ML injection Inject into the muscle. 0.5 mL 0   levothyroxine (SYNTHROID) 112 MCG tablet Take 1 tablet (112 mcg total) by mouth every morning and 1.5 tablets by mouth on Wednesdays. 100 tablet 3   Multiple Vitamins-Minerals (MULTIPLE VITAMINS/WOMENS PO) Take by mouth.     NEEDLE, DISP, 25 G 25G X 1-1/2" MISC 1 Device by Does not apply route every 30 (thirty) days. 12 each 1   phentermine (ADIPEX-P) 37.5 MG tablet Take 1 tablet (37.5 mg total) by mouth daily before breakfast. 30 tablet 3   rosuvastatin (CRESTOR) 5 MG tablet Take 1 tablet (5 mg total) by mouth daily. 90 tablet 3   tiZANidine (ZANAFLEX) 2 MG tablet Take 1 tablet (2 mg total) by mouth at bedtime as needed for muscle spasms. 30 tablet 2    triamcinolone cream (KENALOG) 0.1 % Apply twice daily to bites as needed. 45 g 3   VITAMIN D PO Take 200 Int'l Units/day by mouth.     No facility-administered medications prior to visit.    ROS:  Review of Systems  Constitutional:  Negative for fatigue, fever and unexpected weight change.  Respiratory:  Negative for cough, shortness of  breath and wheezing.   Cardiovascular:  Negative for chest pain, palpitations and leg swelling.  Gastrointestinal:  Negative for blood in stool, constipation, diarrhea, nausea and vomiting.  Endocrine: Negative for cold intolerance, heat intolerance and polyuria.  Genitourinary:  Negative for dyspareunia, dysuria, flank pain, frequency, genital sores, hematuria, menstrual problem, pelvic pain, urgency, vaginal bleeding, vaginal discharge and vaginal pain.  Musculoskeletal:  Negative for back pain, joint swelling and myalgias.  Skin:  Negative for rash.  Neurological:  Negative for dizziness, syncope, light-headedness, numbness and headaches.  Hematological:  Negative for adenopathy.  Psychiatric/Behavioral:  Positive for agitation. Negative for confusion, dysphoric mood, sleep disturbance and suicidal ideas. The patient is not nervous/anxious.   BREAST: No symptoms   Objective: There were no vitals taken for this visit.   Physical Exam Constitutional:      Appearance: She is well-developed.  Genitourinary:     Vulva normal.     Right Labia: No rash, tenderness or lesions.    Left Labia: No tenderness, lesions or rash.    No vaginal discharge, erythema or tenderness.      Right Adnexa: not tender and no mass present.    Left Adnexa: not tender and no mass present.    No cervical friability or polyp.     Uterus is not enlarged or tender.  Rectum:     Guaiac result negative.     No anal fissure or external hemorrhoid.  Breasts:    Right: No mass, nipple discharge, skin change or tenderness.     Left: No mass, nipple discharge, skin change or  tenderness.  Neck:     Thyroid: No thyromegaly.  Cardiovascular:     Rate and Rhythm: Normal rate and regular rhythm.     Heart sounds: Normal heart sounds. No murmur heard. Pulmonary:     Effort: Pulmonary effort is normal.     Breath sounds: Normal breath sounds.  Abdominal:     Palpations: Abdomen is soft.     Tenderness: There is no abdominal tenderness. There is no guarding or rebound.  Musculoskeletal:        General: Normal range of motion.     Cervical back: Normal range of motion.  Lymphadenopathy:     Cervical: No cervical adenopathy.  Neurological:     General: No focal deficit present.     Mental Status: She is alert and oriented to person, place, and time.     Cranial Nerves: No cranial nerve deficit.  Skin:    General: Skin is warm and dry.  Psychiatric:        Mood and Affect: Mood normal.        Behavior: Behavior normal.        Thought Content: Thought content normal.        Judgment: Judgment normal.  Vitals reviewed.     Assessment/Plan: Encounter for annual routine gynecological examination  Encounter for surveillance of contraceptive pills - Plan: drospirenone-ethinyl estradiol (YAZ) 3-0.02 MG tablet; OCP RF.   Breakthrough bleeding on OCPs--resolved if stays on same generic pills. F/u prn.    No orders of the defined types were placed in this encounter.        GYN counsel adequate intake of calcium and vitamin D, diet and exercise     F/U  No follow-ups on file.  Aydan Phoenix B. Thedora Rings, PA-C 01/02/2024 3:10 PM

## 2024-01-04 ENCOUNTER — Ambulatory Visit: Payer: Commercial Managed Care - PPO | Admitting: Obstetrics and Gynecology

## 2024-01-04 DIAGNOSIS — Z3041 Encounter for surveillance of contraceptive pills: Secondary | ICD-10-CM

## 2024-01-04 DIAGNOSIS — Z01419 Encounter for gynecological examination (general) (routine) without abnormal findings: Secondary | ICD-10-CM

## 2024-01-19 ENCOUNTER — Other Ambulatory Visit: Payer: Self-pay

## 2024-01-27 NOTE — Progress Notes (Unsigned)
 PCP:  Dana Allan, MD   No chief complaint on file.   HPI:      [name] is a 38 y.o. [phone] who LMP was No LMP recorded. (Menstrual status: Oral contraceptives)., presents today for her annual examination.  Her menses are occas on OCPs, and occur only on placebo pills now, lasting 3-4 days, light flow/spotting, occas dysmen. BTB resolved if pt stays on same generic Rx from pharmacy.   Sex activity: single partner, contraception - OCP (estrogen/progesterone). Dyspareunia sx improved.  Last Pap: 08/31/21  Results were: no abnormalities /neg HPV DNA Hx of STDs: none  There is no FH of breast cancer. There is no FH of ovarian cancer. The patient does do self-breast exams.  Tobacco use: The patient denies current or previous tobacco use. Alcohol use: rare No drug use.  Exercise: min active  She does get adequate calcium and Vitamin D in her diet.  Labs with PCP.   Has anxiety/depression, doing pretty well with wellbutrin and buspar combination. Managed by PCP.    Past Medical History:  Diagnosis Date   Annual physical exam 04/09/2020   Anxiety    COVID-19    10/2019 11/2020   Depression    External hemorrhoid 06/11/2019   Hashimoto's thyroiditis 04/29/2022   Hyperhidrosis 01/05/2018   Hypothyroidism    Iron deficiency    Iron deficiency 08/30/2018   Obesity    Thyroid disease     Past Surgical History:  Procedure Laterality Date   CESAREAN SECTION  07/2008   CESAREAN SECTION      Family History  Problem Relation Age of Onset   Hyperlipidemia Mother    Depression Mother    Hypertension Mother    Hypothyroidism Mother    Thyroid disease Mother    Hypertension Father    Diabetes Father    Asthma Father    Heart disease Father    Hyperlipidemia Father    Lupus Sister    Hypothyroidism Sister        hashimotos thyroid removal for precancerous cells   Asthma Sister    Miscarriages / Stillbirths Sister    Depression Son    Heart disease  Maternal Grandmother    Dementia Maternal Grandmother    Thyroid disease Maternal Grandmother    Thyroid disease Maternal Grandfather    Colon cancer Maternal Grandfather 56   Melanoma Maternal Grandfather 63   Cancer Paternal Grandfather        skin     Social History   Socioeconomic History   Marital status: Married    Spouse name: Not on file   Number of children: 1   Years of education: Not on file   Highest education level: Some college, no degree  Occupational History   Not on file  Tobacco Use   Smoking status: Former   Smokeless tobacco: Never   Tobacco comments:    quit smoking 2009   Vaping Use   Vaping status: Never Used  Substance and Sexual Activity   Alcohol use: Yes   Drug use: No   Sexual activity: Yes    Birth control/protection: Pill  Other Topics Concern   Not on file  Social History Narrative   Wears seat belt   Feels safe in relationship    3 kids 2 bio and 1 step son    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      Social Drivers of Health   Financial Resource Strain: Low Risk  (  07/18/2023)   Overall Financial Resource Strain (CARDIA)    Difficulty of Paying Living Expenses: Not hard at all  Food Insecurity: No Food Insecurity (07/18/2023)   Hunger Vital Sign    Worried About Running Out of Food in the Last Year: Never true    Ran Out of Food in the Last Year: Never true  Transportation Needs: No Transportation Needs (07/18/2023)   PRAPARE - Administrator, Civil Service (Medical): No    Lack of Transportation (Non-Medical): No  Physical Activity: Insufficiently Active (07/18/2023)   Exercise Vital Sign    Days of Exercise per Week: 3 days    Minutes of Exercise per Session: 30 min  Stress: Stress Concern Present (07/18/2023)   [employer] of Occupational Health - Occupational Stress Questionnaire    Feeling of Stress : To some extent  Social Connections: Socially Integrated (07/18/2023)   Social Connection and Isolation Panel  [NHANES]    Frequency of Communication with Friends and Family: More than three times a week    Frequency of Social Gatherings with Friends and Family: Not on file    Attends Religious Services: More than 4 times per year    Active Member of [employer] or Organizations: Yes    Attends Banker Meetings: More than 4 times per year    Marital Status: Married  Catering manager Violence: Not At Risk (01/30/2018)   Humiliation, Afraid, Rape, and Kick questionnaire    Fear of Current or Ex-Partner: No    Emotionally Abused: No    Physically Abused: No    Sexually Abused: No    Outpatient Medications Prior to Visit  Medication Sig Dispense Refill   buPROPion (WELLBUTRIN XL) 300 MG 24 hr tablet Take 1 tablet (300 mg total) by mouth every morning. 90 tablet 3   busPIRone (BUSPAR) 10 MG tablet Take 1 tablet (10 mg total) by mouth 2 (two) times daily as needed. 180 tablet 3   cyanocobalamin (VITAMIN B12) 1000 MCG/ML injection Inject 1 mL (1,000 mcg total) into the muscle every 30 (thirty) days. 12 mL 1   drospirenone-ethinyl estradiol (JASMIEL) 3-0.02 MG tablet Take 1 tablet by mouth daily. 84 tablet 0   FIBER ADULT GUMMIES PO Take by mouth.     influenza vac split trivalent PF (FLULAVAL) 0.5 ML injection Inject into the muscle. 0.5 mL 0   levothyroxine (SYNTHROID) 112 MCG tablet Take 1 tablet (112 mcg total) by mouth every morning and 1.5 tablets by mouth on Wednesdays. 100 tablet 3   Multiple Vitamins-Minerals (MULTIPLE VITAMINS/WOMENS PO) Take by mouth.     NEEDLE, DISP, 25 G 25G X 1-1/2" MISC 1 Device by Does not apply route every 30 (thirty) days. 12 each 1   phentermine (ADIPEX-P) 37.5 MG tablet Take 1 tablet (37.5 mg total) by mouth daily before breakfast. 30 tablet 3   rosuvastatin (CRESTOR) 5 MG tablet Take 1 tablet (5 mg total) by mouth daily. 90 tablet 3   tiZANidine (ZANAFLEX) 2 MG tablet Take 1 tablet (2 mg total) by mouth at bedtime as needed for muscle spasms. 30 tablet 2    triamcinolone cream (KENALOG) 0.1 % Apply twice daily to bites as needed. 45 g 3   VITAMIN D PO Take 200 Int'l Units/day by mouth.     No facility-administered medications prior to visit.    ROS:  Review of Systems  Constitutional:  Negative for fatigue, fever and unexpected weight change.  Respiratory:  Negative for cough, shortness of  breath and wheezing.   Cardiovascular:  Negative for chest pain, palpitations and leg swelling.  Gastrointestinal:  Negative for blood in stool, constipation, diarrhea, nausea and vomiting.  Endocrine: Negative for cold intolerance, heat intolerance and polyuria.  Genitourinary:  Negative for dyspareunia, dysuria, flank pain, frequency, genital sores, hematuria, menstrual problem, pelvic pain, urgency, vaginal bleeding, vaginal discharge and vaginal pain.  Musculoskeletal:  Negative for back pain, joint swelling and myalgias.  Skin:  Negative for rash.  Neurological:  Negative for dizziness, syncope, light-headedness, numbness and headaches.  Hematological:  Negative for adenopathy.  Psychiatric/Behavioral:  Positive for agitation. Negative for confusion, dysphoric mood, sleep disturbance and suicidal ideas. The patient is not nervous/anxious.   BREAST: No symptoms   Objective: There were no vitals taken for this visit.   Physical Exam Constitutional:      Appearance: She is well-developed.  Genitourinary:     Vulva normal.     Right Labia: No rash, tenderness or lesions.    Left Labia: No tenderness, lesions or rash.    No vaginal discharge, erythema or tenderness.      Right Adnexa: not tender and no mass present.    Left Adnexa: not tender and no mass present.    No cervical friability or polyp.     Uterus is not enlarged or tender.  Rectum:     Guaiac result negative.     No anal fissure or external hemorrhoid.  Breasts:    Right: No mass, nipple discharge, skin change or tenderness.     Left: No mass, nipple discharge, skin change or  tenderness.  Neck:     Thyroid: No thyromegaly.  Cardiovascular:     Rate and Rhythm: Normal rate and regular rhythm.     Heart sounds: Normal heart sounds. No murmur heard. Pulmonary:     Effort: Pulmonary effort is normal.     Breath sounds: Normal breath sounds.  Abdominal:     Palpations: Abdomen is soft.     Tenderness: There is no abdominal tenderness. There is no guarding or rebound.  Musculoskeletal:        General: Normal range of motion.     Cervical back: Normal range of motion.  Lymphadenopathy:     Cervical: No cervical adenopathy.  Neurological:     General: No focal deficit present.     Mental Status: She is alert and oriented to person, place, and time.     Cranial Nerves: No cranial nerve deficit.  Skin:    General: Skin is warm and dry.  Psychiatric:        Mood and Affect: Mood normal.        Behavior: Behavior normal.        Thought Content: Thought content normal.        Judgment: Judgment normal.  Vitals reviewed.     Assessment/Plan: Encounter for annual routine gynecological examination  Encounter for surveillance of contraceptive pills - Plan: drospirenone-ethinyl estradiol (YAZ) 3-0.02 MG tablet; OCP RF.   Breakthrough bleeding on OCPs--resolved if stays on same generic pills. F/u prn.    No orders of the defined types were placed in this encounter.        GYN counsel adequate intake of calcium and vitamin D, diet and exercise     F/U  No follow-ups on file.  Shareef Eddinger B. Shakeia Krus, PA-C 01/27/2024 12:54 PM

## 2024-01-29 ENCOUNTER — Other Ambulatory Visit: Payer: Self-pay

## 2024-01-29 ENCOUNTER — Ambulatory Visit (INDEPENDENT_AMBULATORY_CARE_PROVIDER_SITE_OTHER): Payer: Commercial Managed Care - PPO | Admitting: Obstetrics and Gynecology

## 2024-01-29 ENCOUNTER — Encounter: Payer: Self-pay | Admitting: Obstetrics and Gynecology

## 2024-01-29 VITALS — BP 114/77 | HR 91 | Ht 69.0 in | Wt 218.0 lb

## 2024-01-29 DIAGNOSIS — Z01419 Encounter for gynecological examination (general) (routine) without abnormal findings: Secondary | ICD-10-CM | POA: Diagnosis not present

## 2024-01-29 DIAGNOSIS — Z3041 Encounter for surveillance of contraceptive pills: Secondary | ICD-10-CM

## 2024-01-29 DIAGNOSIS — N943 Premenstrual tension syndrome: Secondary | ICD-10-CM

## 2024-01-29 MED ORDER — LEVONORGESTREL-ETHINYL ESTRAD 0.1-20 MG-MCG PO TABS
1.0000 | ORAL_TABLET | Freq: Every day | ORAL | 3 refills | Status: DC
  Filled 2024-01-29: qty 84, 84d supply, fill #0
  Filled 2024-04-19 – 2024-04-23 (×2): qty 84, 84d supply, fill #1

## 2024-01-29 NOTE — Patient Instructions (Signed)
 I value your feedback and you entrusting [place] with your care. If you get a [place] patient survey, I would appreciate you taking the time to let us know about your experience today. Thank you! ? ? ?

## 2024-02-02 ENCOUNTER — Other Ambulatory Visit
Admission: RE | Admit: 2024-02-02 | Discharge: 2024-02-02 | Disposition: A | Discharge status: Home / Self Care | Payer: Self-pay | Source: Ambulatory Visit | Attending: Oncology | Admitting: Oncology

## 2024-02-02 DIAGNOSIS — Z006 Encounter for examination for normal comparison and control in clinical research program: Secondary | ICD-10-CM | POA: Insufficient documentation

## 2024-02-15 ENCOUNTER — Other Ambulatory Visit: Payer: Self-pay

## 2024-02-16 LAB — GENECONNECT MOLECULAR SCREEN: Genetic Analysis Overall Interpretation: NEGATIVE

## 2024-02-19 ENCOUNTER — Ambulatory Visit: Payer: 59 | Admitting: Family Medicine

## 2024-02-19 ENCOUNTER — Encounter: Payer: Self-pay | Admitting: Family Medicine

## 2024-02-19 VITALS — BP 116/72 | HR 92 | Temp 98.4°F | Resp 20 | Ht 69.0 in | Wt 218.0 lb

## 2024-02-19 DIAGNOSIS — E039 Hypothyroidism, unspecified: Secondary | ICD-10-CM

## 2024-02-19 DIAGNOSIS — F39 Unspecified mood [affective] disorder: Secondary | ICD-10-CM

## 2024-02-19 DIAGNOSIS — E669 Obesity, unspecified: Secondary | ICD-10-CM | POA: Diagnosis not present

## 2024-02-19 NOTE — Progress Notes (Signed)
 SUBJECTIVE:   Chief Complaint  Patient presents with   mood disorder    3 month follow up    HPI Presents for follow up chronic disease management  Discussed the use of AI scribe software for clinical note transcription with the patient, who gave verbal consent to proceed.  History of Present Illness Kaitlyn Dalton is a 38 year old female who presents for a three-month follow-up regarding weight management and mood concerns.  Over the past year, she has experienced mood changes, including increased irritability the week before her period and sadness during her period. These changes coincide with a busy and stressful year. She recently switched birth control pills due to these symptoms and is currently on Wellbutrin and Buspar for mood management. She takes Buspar once daily but acknowledges she should take it twice daily. She has a history of trying SSRIs but did not like the side effects. Her low libido is affecting her marriage, and she feels overwhelmed by her responsibilities at home and work. She has considered reducing her work hours to manage stress but is unsure if this is feasible. She has not engaged in couples counseling as her husband is not interested, and she has previously tried therapy without finding it helpful.  She reports a slight increase in weight, noting it went up to 218 pounds, but does not find this change significant. She attributes some of the weight fluctuation to the winter season and a busy lifestyle, including managing three children involved in sports and her own work commitments. She has not resumed phentermine and is considering lifestyle changes with the warmer weather, such as increased physical activity and gardening. She has not yet contacted the healthy weight and wellness program for further support.  She experiences a sensation of 'rocks' in her throat, which she attributes to possible postnasal drip, but denies any pain, difficulty swallowing,  or dizziness. She occasionally takes Sudafed for relief and does not use Flonase or other nasal sprays.  She is married with three children who are active in sports. 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 Her husband owns a business with irregular hours, contributing to her stress. No chest pain, heart palpitations, headaches, or dizziness.    PERTINENT PMH / PSH: As above  OBJECTIVE:  BP 116/72   Pulse 92   Temp 98.4 F (36.9 C)   Resp 20   Ht 5\' 9"  (1.753 m)   Wt 218 lb (98.9 kg)   SpO2 99%   BMI 32.19 kg/m    Physical Exam Vitals reviewed.  Constitutional:      General: She is not in acute distress.    Appearance: Normal appearance. She is obese. She is not ill-appearing, toxic-appearing or diaphoretic.  Eyes:     General:        Right eye: No discharge.        Left eye: No discharge.     Conjunctiva/sclera: Conjunctivae normal.  Cardiovascular:     Rate and Rhythm: Normal rate and regular rhythm.     Heart sounds: Normal heart sounds.  Pulmonary:     Effort: Pulmonary effort is normal.     Breath sounds: Normal breath sounds.  Abdominal:     General: Bowel sounds are normal.  Musculoskeletal:        General: Normal range of motion.  Skin:    General: Skin is warm and dry.  Neurological:     General: No focal deficit present.  Mental Status: She is alert and oriented to person, place, and time. Mental status is at baseline.  Psychiatric:        Mood and Affect: Mood normal.        Behavior: Behavior normal.        Thought Content: Thought content normal.        Judgment: Judgment normal.           02/19/2024    8:22 AM 11/20/2023    8:55 AM 07/18/2023    9:04 AM 05/18/2023    9:14 AM 03/23/2023   12:27 PM  Depression screen PHQ 2/9  Decreased Interest 1 1 1 1 1   Down, Depressed, Hopeless 1 0 0 1 1  PHQ - 2 Score 2 1 1 2 2   Altered sleeping 1 0 1 0 1  Tired, decreased energy 1 1  1 1 1   Change in appetite 0 0 0 0 0  Feeling bad or failure about yourself  1 0 1 1 1   Trouble concentrating 1 1 1 1 1   Moving slowly or fidgety/restless 0 0 0 0 0  Suicidal thoughts 0 0 0 0 0  PHQ-9 Score 6 3 5 5 6   Difficult doing work/chores Somewhat difficult Not difficult at all Not difficult at all Not difficult at all Somewhat difficult      02/19/2024    8:23 AM 11/20/2023    8:55 AM 07/18/2023    9:04 AM 05/18/2023    9:14 AM  GAD 7 : Generalized Anxiety Score  Nervous, Anxious, on Edge 1 1 1 1   Control/stop worrying 0 0 0 0  Worry too much - different things 0 0 0 1  Trouble relaxing 1 2 1 2   Restless 0 1 0 0  Easily annoyed or irritable 1 1 1 1   Afraid - awful might happen 0 0 0 0  Total GAD 7 Score 3 5 3 5   Anxiety Difficulty Not difficult at all Not difficult at all Not difficult at all Somewhat difficult    ASSESSMENT/PLAN:  Mood disorder [facility]) Assessment & Plan: Mood changes likely due to stress and perimenopausal symptoms. She is on Wellbutrin and Buspar, not interested in SSRIs due to past experiences. Stress from family and work may contribute. Multiple responsibilities impacting well-being. Planning vacations for relaxation. - Increase Buspar to twice daily, option to increase to three times daily if needed. - Continue Wellbutrin - Consider reducing work hours or taking time off to manage stress. - Explore couples counseling or other support options.   Obesity (BMI 30-39.9) Assessment & Plan: Slight weight increase to 218 lbs. Increased activity not yet reflected in weight. Not interested in phentermine currently. Discussed non-medication strategies for weight loss. - Break from phentermine for 4-6 weeks, reassess mental and physical status. - Contact [facility] for guidance. - Consider restarting phentermine if status improves after break.   Hypothyroidism, unspecified type Assessment & Plan: No significant symptoms. Thyroid function  last checked three months ago. - Re-evaluate thyroid function at next visit. - Continue Levothyroxine     PDMP reviewed  Return in about 4 weeks (around 03/18/2024) for PCP.  Dana Allan, MD

## 2024-02-19 NOTE — Patient Instructions (Signed)
 It was a pleasure meeting you today. Thank you for allowing me to take part in your health care.  Our goals for today as we discussed include:  Continue Wellbutrin daily Increase Buspar to 10 mg two times as day  Stop Phentermine for now  Look at [facility] website to see if this may be an option for you with your weight loss. [facility] [address] [place] [phone]    Please call to schedule appointment for ADHD/autism/Asperger's evaluation [facility] Attention Specialist [facility] [phone]    [facility] [email or web address] [address] [place] [phone]    [facility] [place]  [address]  [place] [address] [phone]      This is a list of the screening recommended for you and due dates:  Health Maintenance  Topic Date Due   COVID-19 Vaccine (3 - 2024-25 season) 07/30/2023   DTaP/Tdap/Td vaccine (9 - Td or Tdap) 02/28/2026   Pap with HPV screening  08/31/2026   Flu Shot  Completed   Hepatitis C Screening  Completed   HIV Screening  Completed   HPV Vaccine  Aged Out      If you have any questions or concerns, please do not hesitate to call the office at [phone].  I look forward to our next visit and until then take care and stay safe.  Regards,   Dana Allan, MD   [facility]

## 2024-02-25 ENCOUNTER — Encounter: Payer: Self-pay | Admitting: Family Medicine

## 2024-02-25 NOTE — Assessment & Plan Note (Signed)
 No significant symptoms. Thyroid function last checked three months ago. - Re-evaluate thyroid function at next visit. - Continue Levothyroxine

## 2024-02-25 NOTE — Assessment & Plan Note (Signed)
 Slight weight increase to 218 lbs. Increased activity not yet reflected in weight. Not interested in phentermine currently. Discussed non-medication strategies for weight loss. - Break from phentermine for 4-6 weeks, reassess mental and physical status. - Contact [facility] for guidance. - Consider restarting phentermine if status improves after break.

## 2024-02-25 NOTE — Assessment & Plan Note (Addendum)
 Mood changes likely due to stress and perimenopausal symptoms. She is on Wellbutrin and Buspar, not interested in SSRIs due to past experiences. Stress from family and work may contribute. Multiple responsibilities impacting well-being. Planning vacations for relaxation. - Increase Buspar to twice daily, option to increase to three times daily if needed. - Continue Wellbutrin - Consider reducing work hours or taking time off to manage stress. - Explore couples counseling or other support options.

## 2024-04-19 ENCOUNTER — Other Ambulatory Visit (HOSPITAL_COMMUNITY): Payer: Self-pay

## 2024-04-20 ENCOUNTER — Other Ambulatory Visit (HOSPITAL_COMMUNITY): Payer: Self-pay

## 2024-04-21 ENCOUNTER — Other Ambulatory Visit: Payer: Self-pay

## 2024-04-21 ENCOUNTER — Other Ambulatory Visit (HOSPITAL_COMMUNITY): Payer: Self-pay

## 2024-04-23 ENCOUNTER — Other Ambulatory Visit: Payer: Self-pay

## 2024-04-23 ENCOUNTER — Other Ambulatory Visit (HOSPITAL_COMMUNITY): Payer: Self-pay

## 2024-05-21 ENCOUNTER — Other Ambulatory Visit: Payer: Self-pay

## 2024-05-30 ENCOUNTER — Other Ambulatory Visit: Payer: Self-pay

## 2024-05-30 DIAGNOSIS — R635 Abnormal weight gain: Secondary | ICD-10-CM | POA: Diagnosis not present

## 2024-05-30 DIAGNOSIS — R6882 Decreased libido: Secondary | ICD-10-CM | POA: Diagnosis not present

## 2024-05-30 DIAGNOSIS — N951 Menopausal and female climacteric states: Secondary | ICD-10-CM | POA: Diagnosis not present

## 2024-05-30 DIAGNOSIS — R5383 Other fatigue: Secondary | ICD-10-CM | POA: Diagnosis not present

## 2024-05-30 MED ORDER — ZEPBOUND 2.5 MG/0.5ML ~~LOC~~ SOAJ: 2.5000 mg | SUBCUTANEOUS | 0 refills | Status: DC

## 2024-06-04 DIAGNOSIS — R635 Abnormal weight gain: Secondary | ICD-10-CM | POA: Diagnosis not present

## 2024-06-04 DIAGNOSIS — R5383 Other fatigue: Secondary | ICD-10-CM | POA: Diagnosis not present

## 2024-06-04 LAB — BASIC METABOLIC PANEL WITH GFR
BUN: 13 (ref 4–21)
CO2: 20 (ref 13–22)
Chloride: 102 (ref 99–108)
Creatinine: 0.8 (ref 0.5–1.1)
Glucose: 84
Potassium: 4.2 meq/L (ref 3.5–5.1)
Sodium: 138 (ref 137–147)

## 2024-06-04 LAB — CBC AND DIFFERENTIAL
HCT: 42 (ref 36–46)
Hemoglobin: 12.9 (ref 12.0–16.0)
Platelets: 171 K/uL (ref 150–400)
WBC: 6.1

## 2024-06-04 LAB — IRON,TIBC AND FERRITIN PANEL
%SAT: 18
Ferritin: 41
Iron: 66
TIBC: 367
UIBC: 301

## 2024-06-04 LAB — CBC: RBC: 4.55 (ref 3.87–5.11)

## 2024-06-04 LAB — HEMOGLOBIN A1C: Hemoglobin A1C: 5.1

## 2024-06-04 LAB — COMPREHENSIVE METABOLIC PANEL WITH GFR
Calcium: 9.5 (ref 8.7–10.7)
Globulin: 2.2
eGFR: 103

## 2024-06-04 LAB — VITAMIN D 25 HYDROXY (VIT D DEFICIENCY, FRACTURES): Vit D, 25-Hydroxy: 54.6

## 2024-06-04 LAB — TSH: TSH: 0.08 — AB (ref 0.41–5.90)

## 2024-06-04 LAB — LIPID PANEL
Cholesterol: 164 (ref 0–200)
HDL: 61 (ref 35–70)
LDL Cholesterol: 91
Triglycerides: 65 (ref 40–160)

## 2024-06-04 LAB — HEPATIC FUNCTION PANEL
ALT: 52 U/L — AB (ref 7–35)
AST: 36 — AB (ref 13–35)
Alkaline Phosphatase: 92 (ref 25–125)
Bilirubin, Total: 0.3

## 2024-06-04 LAB — VITAMIN B12: Vitamin B-12: 748

## 2024-06-22 ENCOUNTER — Telehealth: Admitting: Family Medicine

## 2024-06-22 DIAGNOSIS — L0390 Cellulitis, unspecified: Secondary | ICD-10-CM | POA: Diagnosis not present

## 2024-06-22 DIAGNOSIS — T63484A Toxic effect of venom of other arthropod, undetermined, initial encounter: Secondary | ICD-10-CM | POA: Diagnosis not present

## 2024-06-22 MED ORDER — PREDNISONE 10 MG (21) PO TBPK: ORAL_TABLET | ORAL | 0 refills | Status: DC

## 2024-06-22 MED ORDER — CLINDAMYCIN HCL 300 MG PO CAPS: 300.0000 mg | ORAL_CAPSULE | Freq: Three times a day (TID) | ORAL | 0 refills | Status: AC

## 2024-06-22 NOTE — Progress Notes (Signed)
 Virtual Visit Consent   Kaitlyn Dalton, you are scheduled for a virtual visit with a [facility] provider today. Just as with appointments in the office, your consent must be obtained to participate. Your consent will be active for this visit and any virtual visit you may have with one of our providers in the next 365 days. If you have a MyChart account, a copy of this consent can be sent to you electronically.  As this is a virtual visit, video technology does not allow for your provider to perform a traditional examination. This may limit your provider's ability to fully assess your condition. If your provider identifies any concerns that need to be evaluated in person or the need to arrange testing (such as labs, EKG, etc.), we will make arrangements to do so. Although advances in technology are sophisticated, we cannot ensure that it will always work on either your end or our end. If the connection with a video visit is poor, the visit may have to be switched to a telephone visit. With either a video or telephone visit, we are not always able to ensure that we have a secure connection.  By engaging in this virtual visit, you consent to the provision of healthcare and authorize for your insurance to be billed (if applicable) for the services provided during this visit. Depending on your insurance coverage, you may receive a charge related to this service.  I need to obtain your verbal consent now. Are you willing to proceed with your visit today? Kaitlyn Dalton has provided verbal consent on 06/22/2024 for a virtual visit (video or telephone). Kaitlyn Dalton, [place]  Date: 06/22/2024 12:51 PM   Virtual Visit via Video Note   I, Kaitlyn Dalton, connected with  Kaitlyn Dalton  ([id number], 11-17-1986) on 06/22/24 at  1:15 PM EDT by a video-enabled telemedicine application and verified that I am speaking with the correct person using two identifiers.  Location: Patient: Virtual Visit  Location Patient: Home Provider: Virtual Visit Location Provider: Home Office   I discussed the limitations of evaluation and management by telemedicine and the availability of in person appointments. The patient expressed understanding and agreed to proceed.    History of Present Illness: Kaitlyn Dalton is a 39 y.o. who identifies as a female who was assigned female at birth, and is being seen today for c/o bug bite on her forearm, two days ago. Pt states it is left forearm.  Pt states it is itchy and painful.  Pt denies fever chills, or difficulty breathing.  Pt states the redness around the forearm is spreading and is warm to the touch.   HPI: HPI  Problems:  Patient Active Problem List   Diagnosis Date Noted   Other stimulant use, unspecified, uncomplicated 05/27/2023   Vitamin D  deficiency 05/27/2023   Mood disorder (HCC) 12/02/2022   Low vitamin D  level 12/02/2022   B12 deficiency 04/19/2021   Hyperlipidemia 03/29/2019   Obesity (BMI 30-39.9) 08/30/2018   Hypothyroidism 10/06/2009   Pharyngitis 10/06/2009    Allergies:  Allergies  Allergen Reactions   Penicillins Hives    Hives during treatment of strep pharyngitis   Medications:  Current Outpatient Medications:    clindamycin  (CLEOCIN ) 300 MG capsule, Take 1 capsule (300 mg total) by mouth 3 (three) times daily for 5 days., Disp: 15 capsule, Rfl: 0   predniSONE  (STERAPRED UNI-PAK 21 TAB) 10 MG (21) TBPK tablet, Take following package directions., Disp: 21 tablet, Rfl: 0  buPROPion  (WELLBUTRIN  XL) 300 MG 24 hr tablet, Take 1 tablet (300 mg total) by mouth every morning., Disp: 90 tablet, Rfl: 3   busPIRone  (BUSPAR ) 10 MG tablet, Take 1 tablet (10 mg total) by mouth 2 (two) times daily as needed., Disp: 180 tablet, Rfl: 3   cyanocobalamin  (VITAMIN B12) 1000 MCG/ML injection, Inject 1 mL (1,000 mcg total) into the muscle every 30 (thirty) days., Disp: 12 mL, Rfl: 1   FIBER ADULT GUMMIES PO, Take by mouth., Disp: , Rfl:     influenza vac split trivalent PF (FLULAVAL ) 0.5 ML injection, Inject into the muscle., Disp: 0.5 mL, Rfl: 0   levonorgestrel -ethinyl estradiol  (AVIANE) 0.1-20 MG-MCG tablet, Take 1 tablet by mouth daily., Disp: 84 tablet, Rfl: 3   levothyroxine  (SYNTHROID ) 112 MCG tablet, Take 1 tablet (112 mcg total) by mouth every morning and 1.5 tablets by mouth on Wednesdays., Disp: 100 tablet, Rfl: 3   Multiple Vitamins-Minerals (MULTIPLE VITAMINS/WOMENS PO), Take by mouth., Disp: , Rfl:    NEEDLE, DISP, 25 G 25G X 1-1/2 MISC, Use as directed every 30 (thirty) days., Disp: 12 each, Rfl: 1   phentermine  (ADIPEX-P ) 37.5 MG tablet, Take 1 tablet (37.5 mg total) by mouth daily before breakfast., Disp: 30 tablet, Rfl: 3   rosuvastatin  (CRESTOR ) 5 MG tablet, Take 1 tablet (5 mg total) by mouth daily., Disp: 90 tablet, Rfl: 3   tirzepatide  (ZEPBOUND ) 2.5 MG/0.5ML Pen, Inject 2.5 mg into the skin once a week., Disp: 2 mL, Rfl: 0   tiZANidine  (ZANAFLEX ) 2 MG tablet, Take 1 tablet (2 mg total) by mouth at bedtime as needed for muscle spasms., Disp: 30 tablet, Rfl: 2   triamcinolone  cream (KENALOG ) 0.1 %, Apply twice daily to bites as needed., Disp: 45 g, Rfl: 3   VITAMIN D  PO, Take 200 Int'l Units/day by mouth., Disp: , Rfl:   Observations/Objective: Patient is well-developed, well-nourished in no acute distress.  Resting comfortably at home.  Head is normocephalic, atraumatic.  No labored breathing.  Speech is clear and coherent with logical content.  Patient is alert and oriented at baseline.  Erythematous area noted to left forearm   Assessment and Plan: 1. Cellulitis, unspecified cellulitis site (Primary) - predniSONE  (STERAPRED UNI-PAK 21 TAB) 10 MG (21) TBPK tablet; Take following package directions.  Dispense: 21 tablet; Refill: 0 - clindamycin  (CLEOCIN ) 300 MG capsule; Take 1 capsule (300 mg total) by mouth 3 (three) times daily for 5 days.  Dispense: 15 capsule; Refill: 0  2. Insect stings,  undetermined intent, initial encounter - predniSONE  (STERAPRED UNI-PAK 21 TAB) 10 MG (21) TBPK tablet; Take following package directions.  Dispense: 21 tablet; Refill: 0 - clindamycin  (CLEOCIN ) 300 MG capsule; Take 1 capsule (300 mg total) by mouth 3 (three) times daily for 5 days.  Dispense: 15 capsule; Refill: 0  -Start Clindamycin  and prednisone   -Pt advised to follow up in person urgent care or PCP for worsening symptoms or fever  Follow Up Instructions: I discussed the assessment and treatment plan with the patient. The patient was provided an opportunity to ask questions and all were answered. The patient agreed with the plan and demonstrated an understanding of the instructions.  A copy of instructions were sent to the patient via MyChart unless otherwise noted below.     The patient was advised to call back or seek an in-person evaluation if the symptoms worsen or if the condition fails to improve as anticipated.    Kaitlyn Mater, PA-C

## 2024-06-22 NOTE — Patient Instructions (Signed)
 Margarie Edsel Saa, thank you for joining [name], PA-C for today's virtual visit.  While this provider is not your primary care provider (PCP), if your PCP is located in our provider database this encounter information will be shared with them immediately following your visit.   A [facility] MyChart account gives you access to today's visit and all your visits, tests, and labs performed at [facility]  click here if you don't have a [facility] MyChart account or go to [email or web address]  Consent: (Patient) Kaitlyn Dalton provided verbal consent for this virtual visit at the beginning of the encounter.  Current Medications:  Current Outpatient Medications:    clindamycin  (CLEOCIN ) 300 MG capsule, Take 1 capsule (300 mg total) by mouth 3 (three) times daily for 5 days., Disp: 15 capsule, Rfl: 0   predniSONE  (STERAPRED UNI-PAK 21 TAB) 10 MG (21) TBPK tablet, Take following package directions., Disp: 21 tablet, Rfl: 0   buPROPion  (WELLBUTRIN  XL) 300 MG 24 hr tablet, Take 1 tablet (300 mg total) by mouth every morning., Disp: 90 tablet, Rfl: 3   busPIRone  (BUSPAR ) 10 MG tablet, Take 1 tablet (10 mg total) by mouth 2 (two) times daily as needed., Disp: 180 tablet, Rfl: 3   cyanocobalamin  (VITAMIN B12) 1000 MCG/ML injection, Inject 1 mL (1,000 mcg total) into the muscle every 30 (thirty) days., Disp: 12 mL, Rfl: 1   FIBER ADULT GUMMIES PO, Take by mouth., Disp: , Rfl:    influenza vac split trivalent PF (FLULAVAL ) 0.5 ML injection, Inject into the muscle., Disp: 0.5 mL, Rfl: 0   levonorgestrel -ethinyl estradiol  (AVIANE) 0.1-20 MG-MCG tablet, Take 1 tablet by mouth daily., Disp: 84 tablet, Rfl: 3   levothyroxine  (SYNTHROID ) 112 MCG tablet, Take 1 tablet (112 mcg total) by mouth every morning and 1.5 tablets by mouth on Wednesdays., Disp: 100 tablet, Rfl: 3   Multiple Vitamins-Minerals (MULTIPLE VITAMINS/WOMENS PO), Take by mouth., Disp: , Rfl:    NEEDLE, DISP, 25 G  25G X 1-1/2 MISC, Use as directed every 30 (thirty) days., Disp: 12 each, Rfl: 1   phentermine  (ADIPEX-P ) 37.5 MG tablet, Take 1 tablet (37.5 mg total) by mouth daily before breakfast., Disp: 30 tablet, Rfl: 3   rosuvastatin  (CRESTOR ) 5 MG tablet, Take 1 tablet (5 mg total) by mouth daily., Disp: 90 tablet, Rfl: 3   tirzepatide  (ZEPBOUND ) 2.5 MG/0.5ML Pen, Inject 2.5 mg into the skin once a week., Disp: 2 mL, Rfl: 0   tiZANidine  (ZANAFLEX ) 2 MG tablet, Take 1 tablet (2 mg total) by mouth at bedtime as needed for muscle spasms., Disp: 30 tablet, Rfl: 2   triamcinolone  cream (KENALOG ) 0.1 %, Apply twice daily to bites as needed., Disp: 45 g, Rfl: 3   VITAMIN D  PO, Take 200 Int'l Units/day by mouth., Disp: , Rfl:    Medications ordered in this encounter:  Meds ordered this encounter  Medications   predniSONE  (STERAPRED UNI-PAK 21 TAB) 10 MG (21) TBPK tablet    Sig: Take following package directions.    Dispense:  21 tablet    Refill:  0    Please dispense one standard blister pack taper.   clindamycin  (CLEOCIN ) 300 MG capsule    Sig: Take 1 capsule (300 mg total) by mouth 3 (three) times daily for 5 days.    Dispense:  15 capsule    Refill:  0     *If you need refills on other medications prior to your next appointment, please contact your pharmacy*  Follow-Up:  Call back or seek an in-person evaluation if the symptoms worsen or if the condition fails to improve as anticipated.  [facility] [phone]  Other Instructions Cellulitis, Adult  Cellulitis is a skin infection. The infected area is often warm, red, swollen, and sore. It occurs most often on the legs, feet, and toes, but can happen on any part of the body. This condition can be life-threatening without treatment. It is very important to get treated right away. What are the causes? This condition is caused by bacteria. The bacteria enter through a break in the skin, such as: A cut. A burn. A bug bite. An  animal bite. An open sore. A crack. What increases the risk? Having a weak body's defense system (immune system). Being older than 37 years old. Having a blood sugar problem (diabetes). Having a long-term liver disease (cirrhosis) or kidney disease. Being very overweight (obese). Having a skin problem, such as: An itchy rash. A rash caused by a fungus. A rash with blisters. Slow movement of blood in the veins (venous stasis). Fluid buildup below the skin (edema). This condition is more likely to occur in people who: Have open cuts, burns, bites, or scrapes on the skin. Have been treated with high-energy rays (radiation). Use IV drugs. What are the signs or symptoms? Skin that: Looks red or purple, or slightly darker than your usual skin color. Has streaks. Has spots. Is swollen. Is sore or painful when you touch it. Is warm. A fever. Chills. Blisters. Tiredness (fatigue). How is this treated? Medicines to treat infections or allergies. Rest. Placing cold or warm cloths on the skin. Staying in the hospital, if the condition is very bad. You may need medicines through an IV. Follow these instructions at home: Medicines Take over-the-counter and prescription medicines only as told by your doctor. If you were prescribed antibiotics, take them as told by your doctor. Do not stop using them even if you start to feel better. General instructions Drink enough fluid to keep your pee (urine) pale yellow. Do not touch or rub the infected area. Raise (elevate) the infected area above the level of your heart while you are sitting or lying down. Return to your normal activities when your doctor says that it is safe. Place cold or warm cloths on the area as told by your doctor. Keep all follow-up visits. Your doctor will need to make sure that a more serious infection is not developing. Contact a doctor if: You have a fever. You do not start to get better after 1-2 days of  treatment. Your bone or joint under the infected area starts to hurt after the skin has healed. Your infection comes back in the same area or another area. Signs of this may include: You have a swollen bump in the area. Your red area gets larger, turns dark in color, or hurts more. You have more fluid coming from the wound. Pus or a bad smell develops in your infected area. You have more pain. You feel sick and have muscle aches and weakness. You develop vomiting or watery poop that will not go away. Get help right away if: You see red streaks coming from the area. You notice the skin turns purple or black and falls off. These symptoms may be an emergency. Get help right away. Call 911. Do not wait to see if the symptoms will go away. Do not drive yourself to the hospital. This information is not intended to replace advice  given to you by your health care provider. Make sure you discuss any questions you have with your health care provider. Document Revised: 07/12/2022 Document Reviewed: 07/12/2022 Elsevier Patient Education  2024 Elsevier Inc[employer] IT sales professional, Adult An insect bite can make your skin red, itchy, and swollen. Some insects can spread disease to people with a bite. However, most insect bites do not lead to disease, and most are not serious. What are the causes? Insects may bite for many reasons, including: Hunger. To defend themselves. Insects that bite include: Spiders. Mosquitoes. Flies. Ticks and fleas. Ants. Kissing bugs. Chiggers. What are the signs or symptoms? Symptoms often last for 2-4 days. However, itching can last up to 10 days. Symptoms include: Itching or pain in the bite area. Redness and swelling in the bite area. An open wound. In rare cases, a person may have a very bad allergic reaction (anaphylactic reaction) to a bite. Symptoms of an anaphylactic reaction may include: Feeling warm in the face (flushed). Your face may turn red. Itchy, red,  swollen areas of skin (hives). Swelling of the eyes, lips, face, mouth, tongue, or throat. Trouble with breathing, talking, or swallowing. High-pitched whistling sounds, most often when breathing out (wheezing). Feeling dizzy or light-headed. Fainting. Pain or cramps in your belly (abdomen). Vomiting. Watery poop (diarrhea). How is this treated? Most insect bites are not serious. Symptoms often go away on their own. When treatment is advised, it may include: Putting ice on the bite area. Putting a cream or lotion, like calamine lotion, on the bite area. This helps with itching. Using medicines called antihistamines. You may also need: A tetanus shot if you are not up to date. An antibiotic cream or medicine. This treatment is needed if the bite area gets infected. Follow these instructions at home: Bite area care  Do not scratch the bite area. It may help to cover the bite area with a bandage or close-fitting clothing. Keep the bite area clean and dry. Check the bite area every day for signs of infection. Check for: More redness, swelling, or pain. Fluid or blood. Warmth. Pus or a bad smell. Wash your hands often. Managing pain, itching, and swelling  You may put any of these on the bite area as told by your doctor: A paste made of baking soda and water. Cortisone cream. Calamine lotion. If told, put ice on the bite area. To do this: Put ice in a plastic bag. Place a towel between your skin and the bag. Leave the ice on for 20 minutes, 2-3 times a day. If your skin turns bright red, take off the ice right away to prevent skin damage. The risk of skin damage is higher if you cannot feel pain, heat, or cold. General instructions Apply or take over-the-counter and prescription medicines only as told by your doctor. If you were prescribed antibiotics, take or apply them as told by your doctor. Do not stop using them even if you start to feel better. How is this prevented? To  help you have a lower risk of insect bites: When you are outside, wear clothes that cover your arms and legs. Use insect repellent. The best insect repellents contain one of these: DEET. Picaridin. Oil of lemon eucalyptus (OLE). IR3535. Consider spraying your clothing with a pesticide called permethrin. Permethrin helps prevent insect bites. It works for several weeks and for up to 5-6 clothing washes. Do not apply permethrin directly to the skin. If your home windows do not have screens, think  about putting some in. If you will be sleeping in an area where there are mosquitoes, consider covering your sleeping area with a mosquito net. Contact a doctor if: You have redness, swelling, or pain in the bite area. You have fluid or blood coming from the bite area. The bite area feels warm to the touch. You have pus or a bad smell coming from the bite area. You have a fever. Get help right away if: You have joint pain. You have a rash. You feel weak or more tired than you normally do. You have neck pain or a headache. You have signs of an anaphylactic reaction. Signs may include: Swelling of your eyes, lips, face, mouth, tongue, or throat. Feeling warm in the face. Itchy, red, swollen areas of skin. Trouble with breathing, talking, or swallowing. Wheezing. Feeling dizzy or light-headed. Fainting. Pain or cramps in your belly. Vomiting or watery poop. These symptoms may be an emergency. Get help right away. Call 911. Do not wait to see if symptoms will go away. Do not drive yourself to the hospital. Summary An insect bite can make your skin red, itchy, and swollen. Treatment is usually not needed. Symptoms often go away on their own. Do not scratch the bite area. Keep it clean and dry. Use insect repellent to help prevent insect bites. Contact a doctor if you have signs of infection. This information is not intended to replace advice given to you by your health care provider. Make  sure you discuss any questions you have with your health care provider. Document Revised: 02/08/2022 Document Reviewed: 02/08/2022 Elsevier Patient Education  2024 Elsevier Inc.   If you have been instructed to have an in-person evaluation today at a local Urgent Care facility, please use the link below. It will take you to a list of all of our available [facility], including address, phone number and hours of operation. Please do not delay care.  [facility]  If you or a family member do not have a primary care provider, use the link below to schedule a visit and establish care. When you choose a [facility] primary care physician or advanced practice provider, you gain a long-term partner in health. Find a Primary Care Provider  Learn more about [facility]'s in-office and virtual care options: [facility] Get Care Now

## 2024-06-22 NOTE — Progress Notes (Signed)
 E-Visit for Insect Sting  Thank you for describing the insect sting for us .  Here is how we plan to help!  An infected or complicated stinger that requires you to be seen in a face to face visit with a provider today.    Thank you for the details you included in the comment boxes. Those details are very helpful in determining the best course of treatment for you and help us  to provide the best care.Because of possible infection, we recommend that you schedule a Virtual Urgent Care video visit in order for the provider to better assess what is going on.  The provider will be able to give you a more accurate diagnosis and treatment plan if we can more freely discuss your symptoms and with the addition of a virtual examination.   If you change your visit to a video visit, we will bill your insurance (similar to an office visit) and you will not be charged for this e-Visit. You will be able to stay at home and speak with the first available [facility] advanced practice provider. The link to do a video visit is in the drop down Menu tab of your Welcome screen in MyChart.     The 2 greatest risks from insect stings are allergic reaction, which can be fatal in some people and infection, which is more common and less serious.  Bees, wasps, yellow jackets, and hornets belong to a class of insects called Hymenoptera.  Most insect stings cause only minor discomfort.  Stings can happen anywhere on the body and can be painful.  Most stings are from honey bees or yellow jackets.  Fire ants can sting multiple times.  The sites of the stings are more likely to become infected.     What can be used to prevent Insect Stings?  Insect repellant with at least 20% DEET.  Wearing long pants and shirts with socks and shoes.  Wear dark or drab-colored clothes rather than bright colors.  Avoid using perfumes and hair sprays; these attract insects.  HOME CARE ADVICE:  1. Stinger removal: The stinger looks  like a tiny black dot in the sting. Use a fingernail, credit card edge, or knife-edge to scrape it off.  Don't pull it out because it squeezes out more venom. If the stinger is below the skin surface, leave it alone.  It will be shed with normal skin healing. 2. Use cold compresses to the area of the sting for 10-20 minutes.  You may repeat this as needed to relieve symptoms of pain and swelling. 3.  For pain relief, take acetominophen 650 mg 4-6 hours as needed or ibuprofen  400 mg every 6-8 hours as needed or naproxen 250-500 mg every 12 hours as needed. 4.  You can also use hydrocortisone cream 0.5% or 1% up to 4 times daily as needed for itching. 5.  If the sting becomes very itchy, take Benadryl  25-50 mg, follow directions on box. 6.  Wash the area 2-3 times daily with antibacterial soap and warm water. 7. Call your Doctor if: Fever, a severe headache, or rash occur in the next 2 weeks. Sting area begins to look infected. Redness and swelling worsens after home treatment. Your current symptoms become worse.    MAKE SURE YOU:  Understand these instructions. Will watch your condition. Will get help right away if you are not doing well or get worse.  Thank you for choosing an e-visit.  Your e-visit answers were reviewed by a  board certified advanced clinical practitioner to complete your personal care plan. Depending upon the condition, your plan could have included both over the counter or prescription medications.  Please review your pharmacy choice. Make sure the pharmacy is open so you can pick up prescription now. If there is a problem, you may contact your provider through [employer] and have the prescription routed to another pharmacy.  Your safety is important to us . If you have drug allergies check your prescription carefully.   For the next 24 hours you can use MyChart to ask questions about today's visit, request a non-urgent call back, or ask for a work or school  excuse. You will get an email in the next two days asking about your experience. I hope that your e-visit has been valuable and will speed your recovery.  I have spent 5 minutes in review of e-visit questionnaire, review and updating patient chart, medical decision making and response to patient.   [name], PA-C

## 2024-07-02 DIAGNOSIS — R635 Abnormal weight gain: Secondary | ICD-10-CM | POA: Diagnosis not present

## 2024-07-02 DIAGNOSIS — E039 Hypothyroidism, unspecified: Secondary | ICD-10-CM | POA: Diagnosis not present

## 2024-07-02 DIAGNOSIS — Z6833 Body mass index (BMI) 33.0-33.9, adult: Secondary | ICD-10-CM | POA: Diagnosis not present

## 2024-07-15 ENCOUNTER — Other Ambulatory Visit: Payer: Self-pay | Admitting: Internal Medicine

## 2024-07-15 ENCOUNTER — Other Ambulatory Visit: Payer: Self-pay

## 2024-07-15 DIAGNOSIS — F39 Unspecified mood [affective] disorder: Secondary | ICD-10-CM

## 2024-07-16 ENCOUNTER — Other Ambulatory Visit: Payer: Self-pay

## 2024-07-16 MED FILL — Buspirone HCl Tab 10 MG: ORAL | 30 days supply | Qty: 60 | Fill #0 | Status: AC

## 2024-07-17 ENCOUNTER — Other Ambulatory Visit: Payer: Self-pay

## 2024-08-08 ENCOUNTER — Other Ambulatory Visit: Payer: Self-pay

## 2024-08-08 ENCOUNTER — Encounter: Payer: Self-pay | Admitting: Nurse Practitioner

## 2024-08-09 ENCOUNTER — Other Ambulatory Visit: Payer: Self-pay

## 2024-08-09 ENCOUNTER — Ambulatory Visit: Admitting: Nurse Practitioner

## 2024-08-09 ENCOUNTER — Encounter: Payer: Self-pay | Admitting: Nurse Practitioner

## 2024-08-09 VITALS — BP 110/68 | HR 77 | Temp 98.6°F | Ht 69.0 in | Wt 224.4 lb

## 2024-08-09 DIAGNOSIS — F39 Unspecified mood [affective] disorder: Secondary | ICD-10-CM

## 2024-08-09 DIAGNOSIS — E538 Deficiency of other specified B group vitamins: Secondary | ICD-10-CM | POA: Diagnosis not present

## 2024-08-09 DIAGNOSIS — R635 Abnormal weight gain: Secondary | ICD-10-CM | POA: Diagnosis not present

## 2024-08-09 DIAGNOSIS — E782 Mixed hyperlipidemia: Secondary | ICD-10-CM | POA: Diagnosis not present

## 2024-08-09 DIAGNOSIS — E063 Autoimmune thyroiditis: Secondary | ICD-10-CM | POA: Diagnosis not present

## 2024-08-09 DIAGNOSIS — Z6832 Body mass index (BMI) 32.0-32.9, adult: Secondary | ICD-10-CM | POA: Diagnosis not present

## 2024-08-09 MED ORDER — CYANOCOBALAMIN 1000 MCG/ML IJ SOLN
1000.0000 ug | INTRAMUSCULAR | 3 refills | Status: AC
  Filled 2024-08-09: qty 3, 90d supply, fill #0

## 2024-08-09 MED ORDER — ROSUVASTATIN CALCIUM 5 MG PO TABS
5.0000 mg | ORAL_TABLET | Freq: Every day | ORAL | 3 refills | Status: AC
  Filled 2024-08-09: qty 90, 90d supply, fill #0
  Filled 2024-11-13: qty 90, 90d supply, fill #1

## 2024-08-09 MED ORDER — NEEDLE (DISP) 25G X 1-1/2" MISC
1.0000 | 1 refills | Status: AC
  Filled 2024-08-09: qty 12, 360d supply, fill #0

## 2024-08-09 MED ORDER — BUPROPION HCL ER (XL) 300 MG PO TB24
300.0000 mg | ORAL_TABLET | Freq: Every morning | ORAL | 3 refills | Status: AC
  Filled 2024-08-09: qty 90, 90d supply, fill #0
  Filled 2024-11-13: qty 90, 90d supply, fill #1

## 2024-08-09 MED ORDER — BUSPIRONE HCL 10 MG PO TABS
10.0000 mg | ORAL_TABLET | Freq: Two times a day (BID) | ORAL | 3 refills | Status: AC
  Filled 2024-08-09: qty 180, 90d supply, fill #0

## 2024-08-09 NOTE — Progress Notes (Signed)
 Leron Glance, NP-C Phone: [phone]  Kaitlyn Dalton is a 38 y.o. female who presents today for transfer of care.   Discussed the use of AI scribe software for clinical note transcription with the patient, who gave verbal consent to proceed.  History of Present Illness   Sarahgrace Broman is a 38 year old female with Hashimoto's thyroiditis who presents for transfer of care.  Approximately two months ago, her thyroid  levels were noted to be off, leading her to stop taking an extra half dose of her thyroid  medication on Wednesdays. Since this adjustment, she has not experienced symptoms such as anxiety, heart palpitations, skin changes, or temperature problems. She has been taking low-dose naltrexone for about five weeks to address elevated thyroid  antibodies consistent with Hashimoto's thyroiditis.  Her thyroid  peroxidase (TPO) antibodies have been consistently elevated, which has been discussed in the context of Hashimoto's. She has been on a stable dose of levothyroxine , 112 mcg daily, except for Wednesdays when she previously took an additional half dose. She stopped taking birth control in June, which she feels has improved her mood. During her pregnancy in 2017, her levothyroxine  dose fluctuated significantly, but it has been stable since her son was [age] years old.  She is currently taking Crestor , but not daily, and attributes a slight elevation in liver enzymes to this medication. She takes 5 mg of Crestor  two to three times a week. She also administers B12 shots monthly and takes Wellbutrin  XL 300 mg once daily.  She has a history of familial obesity, with both parents having undergone bariatric surgery, and she is trying to maintain her health through lifestyle modifications.      Social History   Tobacco Use  Smoking Status Former  Smokeless Tobacco Never  Tobacco Comments   quit smoking 2009     Current Outpatient Medications on File Prior to Visit  Medication Sig  Dispense Refill   FIBER ADULT GUMMIES PO Take by mouth.     Multiple Vitamins-Minerals (MULTIPLE VITAMINS/WOMENS PO) Take by mouth.     triamcinolone  cream (KENALOG ) 0.1 % Apply twice daily to bites as needed. 45 g 3   VITAMIN D  PO Take 200 Int'l Units/day by mouth.     No current facility-administered medications on file prior to visit.     ROS see history of present illness  Objective  Physical Exam Vitals:   08/09/24 1336  BP: 110/68  Pulse: 77  Temp: 98.6 F (37 C)  SpO2: 99%    BP Readings from Last 3 Encounters:  08/09/24 110/68  02/19/24 116/72  01/29/24 114/77   Wt Readings from Last 3 Encounters:  08/09/24 224 lb 6.4 oz (101.8 kg)  02/19/24 218 lb (98.9 kg)  01/29/24 218 lb (98.9 kg)    Physical Exam Constitutional:      General: She is not in acute distress.    Appearance: Normal appearance.  HENT:     Head: Normocephalic.  Cardiovascular:     Rate and Rhythm: Normal rate and regular rhythm.     Heart sounds: Normal heart sounds.  Pulmonary:     Effort: Pulmonary effort is normal.     Breath sounds: Normal breath sounds.  Skin:    General: Skin is warm and dry.  Neurological:     General: No focal deficit present.     Mental Status: She is alert.  Psychiatric:        Mood and Affect: Mood normal.  Behavior: Behavior normal.      Assessment/Plan: Please see individual problem list.  Hypothyroidism due to Hashimoto thyroiditis Assessment & Plan: Hashimoto's disease presents with elevated TPO antibodies. Recent levothyroxine  adjustment has been made. Her mood has improved, possibly due to low-dose naltrexone. T4 is slightly elevated, and TSH instability may be due to hormonal changes. Order a thyroid  panel including TPO antibodies. Recommend an AIP diet focusing on gluten-free and dairy-free options. Continue current levothyroxine  dosing of 112 mcg daily. Monitor thyroid  function every six months unless medication dosing  changes.  Orders: -     TSH+T4F+T3Free+ThyAbs+TPO+VD25  Mood disorder Assessment & Plan: Mood disorder has improved, possibly due to low-dose naltrexone and stopping birth control. She is on Wellbutrin  XL 300 mg daily and Buspar  10 mg twice daily with no anxiety or depression symptoms. Continue Wellbutrin  XL 300 mg daily and Buspar  10 mg twice daily. Continue low-dose naltrexone as prescribed via teledoc. Encouraged to contact if worsening symptoms, unusual behavior changes or suicidal thoughts occur.   Orders: -     busPIRone  HCl; Take 1 tablet (10 mg total) by mouth 2 (two) times daily.  Dispense: 180 tablet; Refill: 3 -     buPROPion  HCl ER (XL); Take 1 tablet (300 mg total) by mouth every morning.  Dispense: 90 tablet; Refill: 3  Mixed hyperlipidemia Assessment & Plan: Mixed hyperlipidemia is managed with Crestor . There is a slightly elevated liver enzyme, possibly due to statin use. Crestor  is used inconsistently, 2-3 times a week. Order liver function tests to monitor ALT levels. Continue Crestor  as prescribed, 5 mg daily, but she may continue the current dosing pattern. Encourage healthy diet and regular exercise.   Orders: -     Rosuvastatin  Calcium ; Take 1 tablet (5 mg total) by mouth daily.  Dispense: 90 tablet; Refill: 3 -     Hepatic function panel  B12 deficiency Assessment & Plan: Vitamin B12 deficiency is managed with monthly B12 injections. No new concerns. Continue monthly B12 injections.  Orders: -     Cyanocobalamin ; Inject 1 mL (1,000 mcg total) into the muscle every 30 (thirty) days.  Dispense: 3 mL; Refill: 3 -     Needle (Disp); Use as directed every 30 (thirty) days.  Dispense: 12 each; Refill: 1      Return in about 6 months (around 02/06/2025) for Follow up.   Leron Glance, NP-C [facility]

## 2024-08-10 LAB — HEPATIC FUNCTION PANEL
ALT: 36 IU/L — ABNORMAL HIGH (ref 0–32)
AST: 27 IU/L (ref 0–40)
Albumin: 4.7 g/dL (ref 3.9–4.9)
Alkaline Phosphatase: 98 IU/L (ref 44–121)
Bilirubin Total: 0.2 mg/dL (ref 0.0–1.2)
Bilirubin, Direct: 0.12 mg/dL (ref 0.00–0.40)
Total Protein: 7 g/dL (ref 6.0–8.5)

## 2024-08-10 LAB — TSH+T4F+T3FREE+THYABS+TPO+VD25
Free T4: 1.29 ng/dL (ref 0.82–1.77)
T3, Free: 2.9 pg/mL (ref 2.0–4.4)
TSH: 0.297 u[IU]/mL — ABNORMAL LOW (ref 0.450–4.500)
Thyroglobulin Antibody: 7.4 [IU]/mL — ABNORMAL HIGH (ref 0.0–0.9)
Thyroperoxidase Ab SerPl-aCnc: 101 [IU]/mL — ABNORMAL HIGH (ref 0–34)
Vit D, 25-Hydroxy: 49.7 ng/mL (ref 30.0–100.0)

## 2024-08-12 ENCOUNTER — Other Ambulatory Visit: Payer: Self-pay

## 2024-08-15 ENCOUNTER — Ambulatory Visit: Payer: Self-pay | Admitting: Nurse Practitioner

## 2024-08-16 ENCOUNTER — Other Ambulatory Visit: Payer: Self-pay | Admitting: Nurse Practitioner

## 2024-08-16 ENCOUNTER — Other Ambulatory Visit: Payer: Self-pay

## 2024-08-16 DIAGNOSIS — E063 Autoimmune thyroiditis: Secondary | ICD-10-CM

## 2024-08-16 MED ORDER — LEVOTHYROXINE SODIUM 100 MCG PO TABS
100.0000 ug | ORAL_TABLET | Freq: Every day | ORAL | 0 refills | Status: DC
  Filled 2024-08-16: qty 90, 90d supply, fill #0

## 2024-08-20 ENCOUNTER — Ambulatory Visit
Admission: RE | Admit: 2024-08-20 | Discharge: 2024-08-20 | Disposition: A | Discharge status: Home / Self Care | Source: Ambulatory Visit | Attending: Nurse Practitioner | Admitting: Nurse Practitioner

## 2024-08-20 DIAGNOSIS — E063 Autoimmune thyroiditis: Secondary | ICD-10-CM | POA: Insufficient documentation

## 2024-08-20 DIAGNOSIS — Z8639 Personal history of other endocrine, nutritional and metabolic disease: Secondary | ICD-10-CM | POA: Diagnosis not present

## 2024-08-27 ENCOUNTER — Encounter: Payer: Self-pay | Admitting: Nurse Practitioner

## 2024-08-27 NOTE — Assessment & Plan Note (Signed)
 Hashimoto's disease presents with elevated TPO antibodies. Recent levothyroxine  adjustment has been made. Her mood has improved, possibly due to low-dose naltrexone. T4 is slightly elevated, and TSH instability may be due to hormonal changes. Order a thyroid  panel including TPO antibodies. Recommend an AIP diet focusing on gluten-free and dairy-free options. Continue current levothyroxine  dosing of 112 mcg daily. Monitor thyroid  function every six months unless medication dosing changes.

## 2024-08-27 NOTE — Assessment & Plan Note (Signed)
 Mixed hyperlipidemia is managed with Crestor . There is a slightly elevated liver enzyme, possibly due to statin use. Crestor  is used inconsistently, 2-3 times a week. Order liver function tests to monitor ALT levels. Continue Crestor  as prescribed, 5 mg daily, but she may continue the current dosing pattern. Encourage healthy diet and regular exercise.

## 2024-08-27 NOTE — Assessment & Plan Note (Addendum)
 Mood disorder has improved, possibly due to low-dose naltrexone and stopping birth control. She is on Wellbutrin  XL 300 mg daily and Buspar  10 mg twice daily with no anxiety or depression symptoms. Continue Wellbutrin  XL 300 mg daily and Buspar  10 mg twice daily. Continue low-dose naltrexone as prescribed via teledoc. Encouraged to contact if worsening symptoms, unusual behavior changes or suicidal thoughts occur.

## 2024-08-27 NOTE — Assessment & Plan Note (Signed)
 Vitamin B12 deficiency is managed with monthly B12 injections. No new concerns. Continue monthly B12 injections.

## 2024-08-30 ENCOUNTER — Ambulatory Visit: Payer: Self-pay | Admitting: Nurse Practitioner

## 2024-09-18 ENCOUNTER — Other Ambulatory Visit: Payer: Self-pay

## 2024-09-18 MED ORDER — FLUZONE 0.5 ML IM SUSY
0.5000 mL | PREFILLED_SYRINGE | Freq: Once | INTRAMUSCULAR | 0 refills | Status: AC
  Filled 2024-09-18: qty 0.5, 1d supply, fill #0

## 2024-10-11 DIAGNOSIS — R635 Abnormal weight gain: Secondary | ICD-10-CM | POA: Diagnosis not present

## 2024-10-11 DIAGNOSIS — Z6832 Body mass index (BMI) 32.0-32.9, adult: Secondary | ICD-10-CM | POA: Diagnosis not present

## 2024-10-18 ENCOUNTER — Encounter: Payer: Self-pay | Admitting: Nurse Practitioner

## 2024-10-18 ENCOUNTER — Other Ambulatory Visit (INDEPENDENT_AMBULATORY_CARE_PROVIDER_SITE_OTHER)

## 2024-10-18 DIAGNOSIS — E063 Autoimmune thyroiditis: Secondary | ICD-10-CM | POA: Diagnosis not present

## 2024-10-18 LAB — TSH: TSH: 1.1 u[IU]/mL (ref 0.35–5.50)

## 2024-11-13 ENCOUNTER — Other Ambulatory Visit: Payer: Self-pay

## 2024-11-13 ENCOUNTER — Other Ambulatory Visit: Payer: Self-pay | Admitting: Nurse Practitioner

## 2024-11-13 DIAGNOSIS — E063 Autoimmune thyroiditis: Secondary | ICD-10-CM

## 2024-11-13 MED ORDER — LEVOTHYROXINE SODIUM 100 MCG PO TABS
100.0000 ug | ORAL_TABLET | Freq: Every day | ORAL | 3 refills | Status: AC
  Filled 2024-11-13: qty 90, 90d supply, fill #0

## 2025-02-07 ENCOUNTER — Ambulatory Visit: Admitting: Nurse Practitioner
